# Patient Record
Sex: Female | Born: 1965 | Hispanic: Yes | Marital: Married | Smoking: Former smoker
Health system: Southern US, Community
[De-identification: ages and names within clinical notes are randomized; demographics above are authoritative.]

## PROBLEM LIST (undated history)

## (undated) HISTORY — PX: OTHER SURGICAL HISTORY: SHX169

## (undated) HISTORY — PX: VAGINAL HYSTERECTOMY: SUR661

---

## 2004-11-09 ENCOUNTER — Encounter (INDEPENDENT_AMBULATORY_CARE_PROVIDER_SITE_OTHER): Payer: Self-pay | Admitting: *Deleted

## 2004-11-09 LAB — CONVERTED CEMR LAB

## 2004-11-19 ENCOUNTER — Ambulatory Visit: Payer: Self-pay | Admitting: Family Medicine

## 2004-11-22 ENCOUNTER — Ambulatory Visit (HOSPITAL_COMMUNITY): Admission: RE | Admit: 2004-11-22 | Discharge: 2004-11-22 | Payer: Self-pay | Admitting: *Deleted

## 2004-11-26 ENCOUNTER — Ambulatory Visit: Payer: Self-pay | Admitting: Family Medicine

## 2004-12-31 ENCOUNTER — Ambulatory Visit: Payer: Self-pay | Admitting: Family Medicine

## 2005-01-01 ENCOUNTER — Ambulatory Visit: Payer: Self-pay | Admitting: Family Medicine

## 2005-01-29 ENCOUNTER — Ambulatory Visit: Payer: Self-pay | Admitting: Family Medicine

## 2005-02-12 ENCOUNTER — Ambulatory Visit: Payer: Self-pay | Admitting: Sports Medicine

## 2005-02-27 ENCOUNTER — Ambulatory Visit: Payer: Self-pay | Admitting: Family Medicine

## 2005-03-12 ENCOUNTER — Ambulatory Visit: Payer: Self-pay | Admitting: Family Medicine

## 2005-03-26 ENCOUNTER — Ambulatory Visit: Payer: Self-pay | Admitting: Family Medicine

## 2005-03-31 ENCOUNTER — Ambulatory Visit: Payer: Self-pay | Admitting: Sports Medicine

## 2005-04-07 ENCOUNTER — Ambulatory Visit: Payer: Self-pay | Admitting: Family Medicine

## 2005-04-10 ENCOUNTER — Inpatient Hospital Stay (HOSPITAL_COMMUNITY): Admission: AD | Admit: 2005-04-10 | Discharge: 2005-04-12 | Payer: Self-pay | Admitting: *Deleted

## 2005-04-10 ENCOUNTER — Ambulatory Visit: Payer: Self-pay | Admitting: Family Medicine

## 2005-06-11 ENCOUNTER — Ambulatory Visit: Payer: Self-pay | Admitting: Family Medicine

## 2006-02-27 IMAGING — US US OB DETAIL+14 WK
1 series · 13 of 28 positions shown · non-contrast
Comparison: none

CLINICAL DATA: 38-year-old with scan for anatomy at 20 weeks.

[Series 1: us ob detail+14 wk · 0.41mm/px · 13 of 88 slices shown]
[im 4/88]
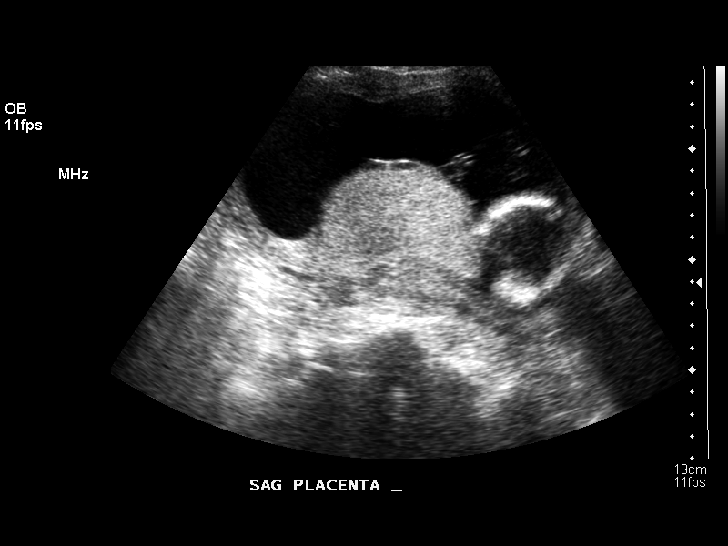
[im 10/88]
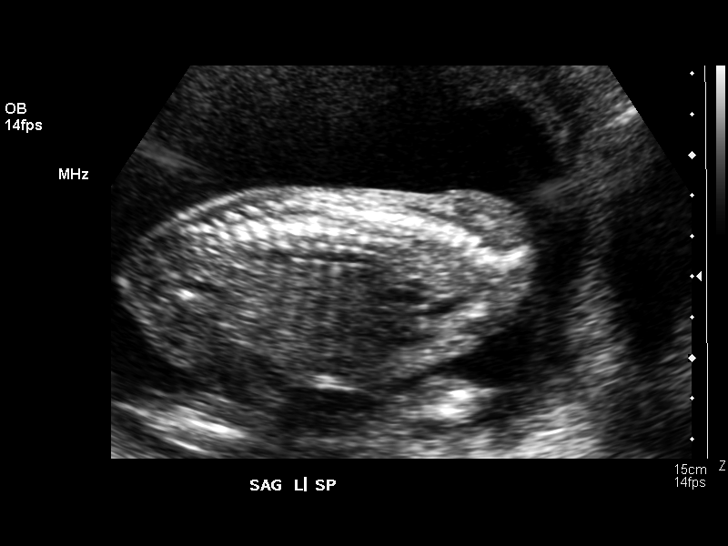
[im 17/88]
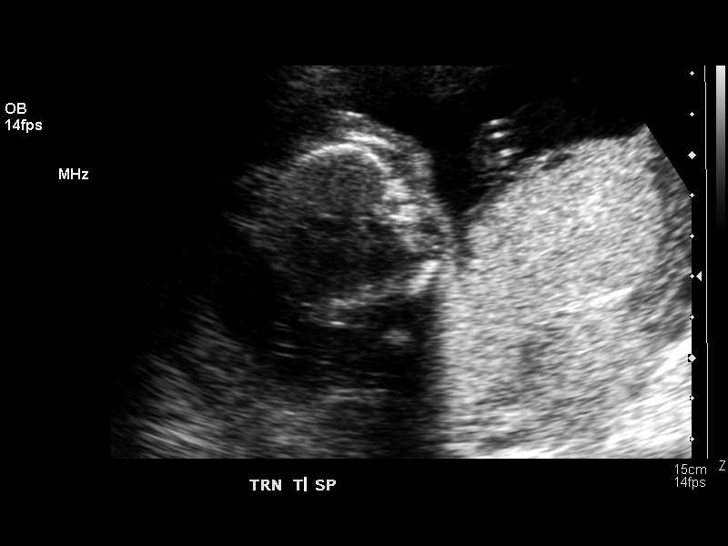
[im 23/88]
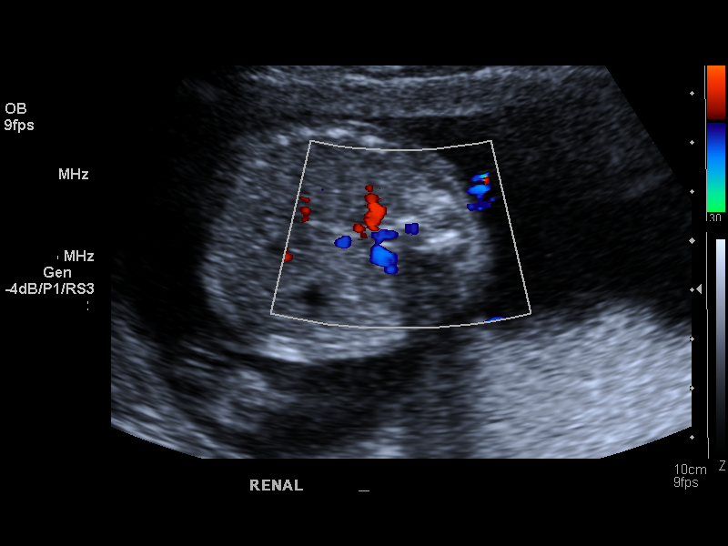
[im 30/88]
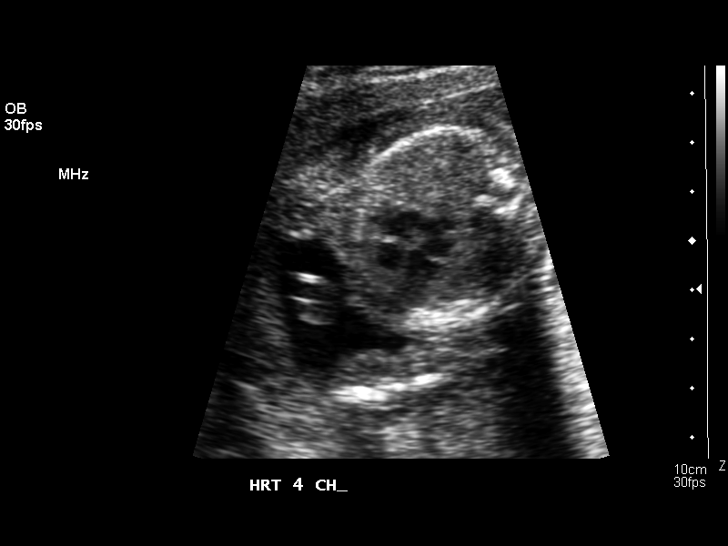
[im 36/88]
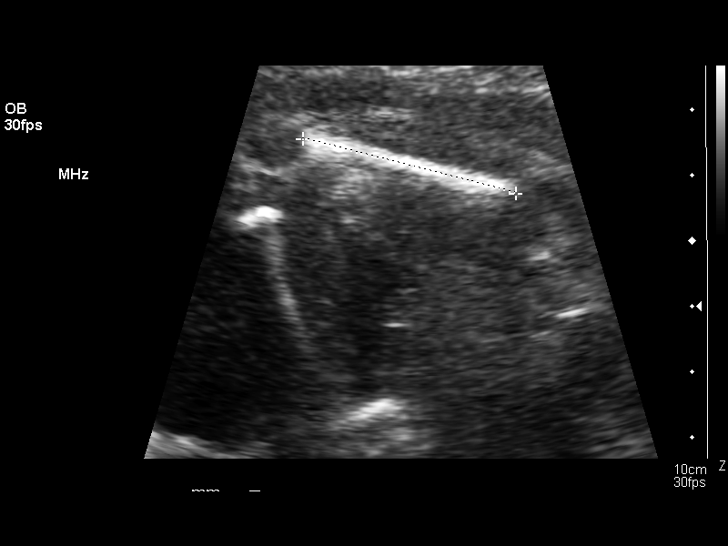
[im 46/88]
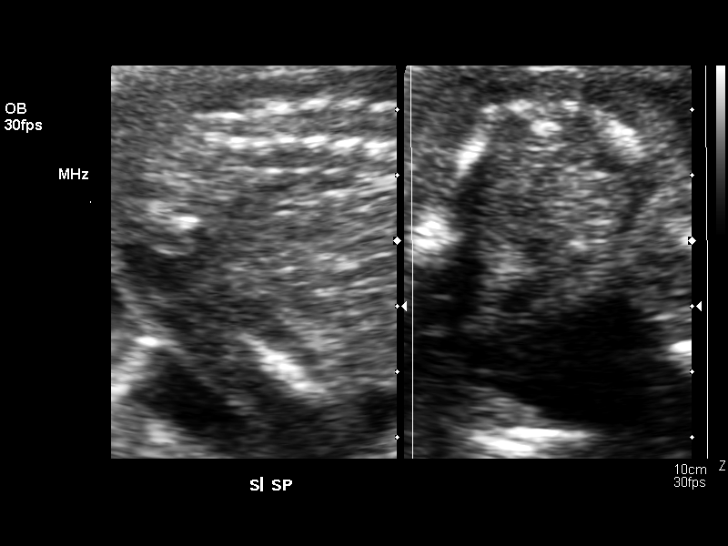
[im 52/88]
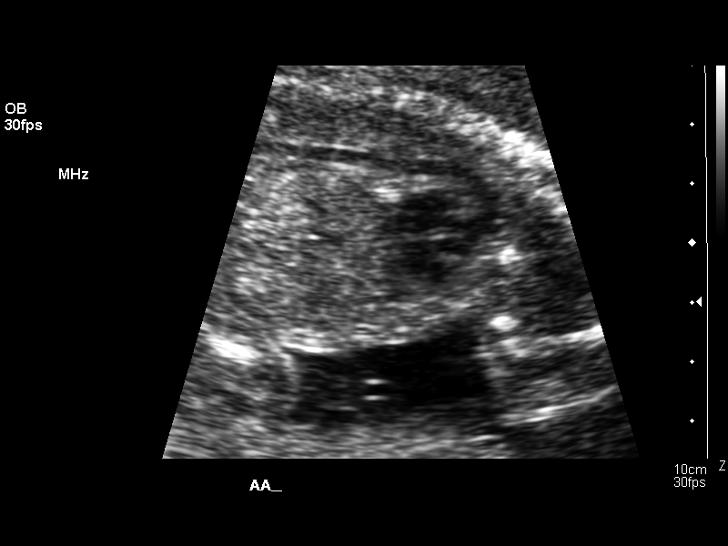
[im 59/88]
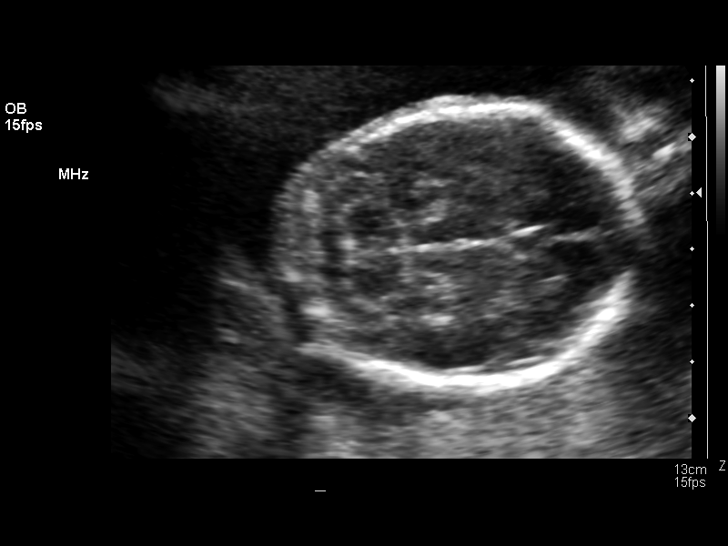
[im 65/88]
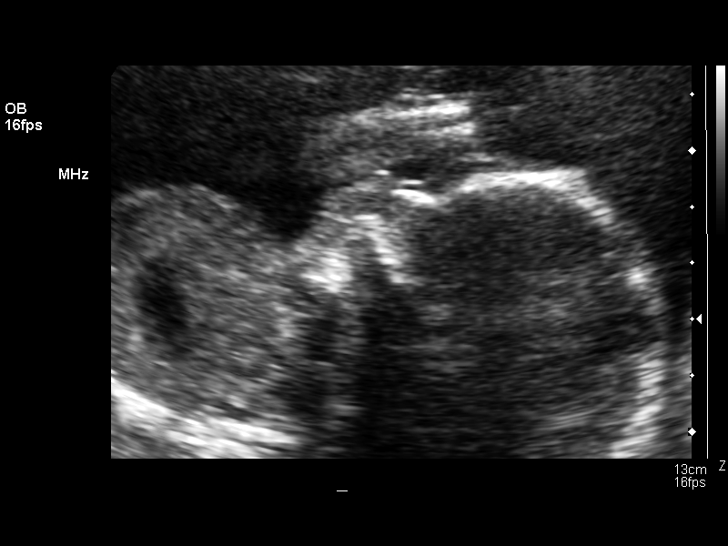
[im 71/88]
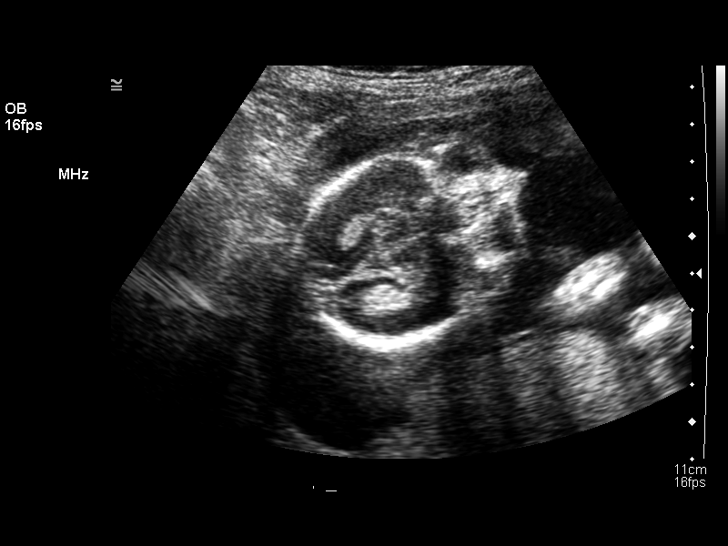
[im 78/88]
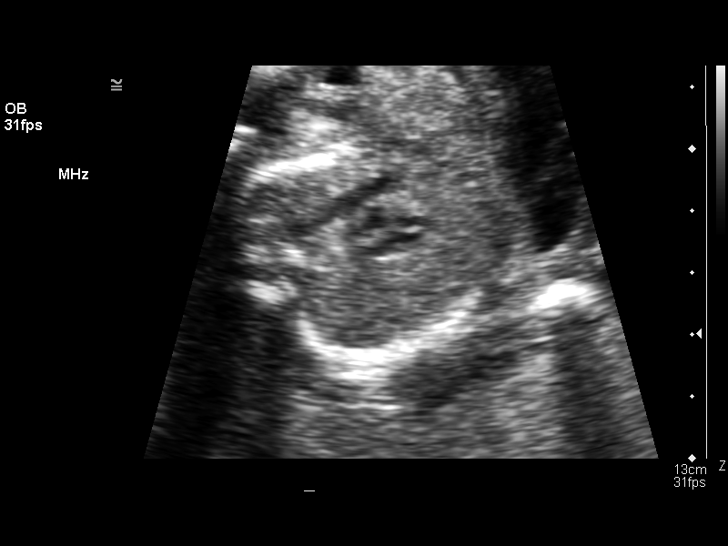
[im 84/88]
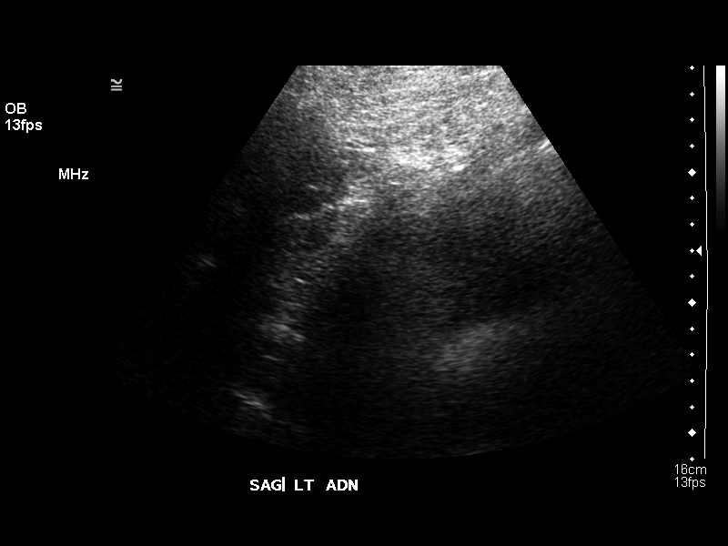

[13 of 28 positions shown; findings below may reference images not displayed]

DETAILED OBSTETRICAL ULTRASOUND:

 Number of Fetuses:  1
 Heart Rate:  139 bpm
 Movement:  Yes 
 Breathing:  No 
 Presentation:  Cephalic/variable
 Placental Location:  Posterior 
 Grade:  1 
 Previa:  No 
 Amniotic Fluid (Subjective):  Normal 
 Amniotic Fluid (Objective):  Vertical pocket 6.1 cm

 FETAL BIOMETRY
 BPD:  5.0 cm  21 w  0 d
 HC:  18.1 cm  20 w  4 d
 AC:  15.6 cm  20 w  5 d
 FL:  3.3 cm  20 w  2 d

 MEAN GA:  20 w  4 d 

 FETAL ANATOMY
 Lateral Ventricles:  Visualized 
 Thalami/CSP:  Visualized 
 Posterior Fossa:  Visualized 
 Nuchal Region:  Visualized 
 Spine:  Visualized 
 4 Chamber Heart on Left:  Visualized 
 Stomach on Left:  Visualized 
 3 Vessel Cord:  Visualized 
 Cord Insertion Site:  Visualized 
 Kidneys:  Visualized 
 Bladder:  Visualized 
 Extremities:  Visualized   

 ADDITIONAL ANATOMY VISUALIZED:  LVOT, RVOT, upper lip, orbits, diaphragm, heel, aortic arch, male genitalia and nasal bone

 MATERNAL UTERINE AND ADNEXAL FINDINGS
 Cervix:  3.3 cm transabdominally
IMPRESSION: Single living intrauterine fetus in variable presentation.  Amniotic fluid volume is within normal limits.   Size and dates correlate within 5 days.   No fetal anomalies are identified.

## 2006-07-10 ENCOUNTER — Encounter (INDEPENDENT_AMBULATORY_CARE_PROVIDER_SITE_OTHER): Payer: Self-pay | Admitting: *Deleted

## 2007-11-08 ENCOUNTER — Telehealth: Payer: Self-pay | Admitting: *Deleted

## 2007-11-09 ENCOUNTER — Ambulatory Visit: Payer: Self-pay | Admitting: Family Medicine

## 2007-11-09 DIAGNOSIS — N912 Amenorrhea, unspecified: Secondary | ICD-10-CM

## 2007-11-09 LAB — CONVERTED CEMR LAB: Beta hcg, urine, semiquantitative: NEGATIVE

## 2009-03-27 ENCOUNTER — Ambulatory Visit: Payer: Self-pay | Admitting: Gynecology

## 2009-03-29 ENCOUNTER — Ambulatory Visit: Payer: Self-pay | Admitting: Gynecology

## 2009-04-02 ENCOUNTER — Ambulatory Visit: Payer: Self-pay | Admitting: Gynecology

## 2009-04-02 ENCOUNTER — Ambulatory Visit (HOSPITAL_BASED_OUTPATIENT_CLINIC_OR_DEPARTMENT_OTHER): Admission: RE | Admit: 2009-04-02 | Discharge: 2009-04-03 | Payer: Self-pay | Admitting: Gynecology

## 2009-04-09 ENCOUNTER — Ambulatory Visit: Payer: Self-pay | Admitting: Gynecology

## 2009-06-05 ENCOUNTER — Ambulatory Visit: Payer: Self-pay | Admitting: Gynecology

## 2009-07-31 ENCOUNTER — Ambulatory Visit: Payer: Self-pay | Admitting: Gynecology

## 2009-08-03 ENCOUNTER — Ambulatory Visit: Payer: Self-pay | Admitting: Gynecology

## 2009-08-08 ENCOUNTER — Ambulatory Visit: Payer: Self-pay | Admitting: Gynecology

## 2009-08-09 ENCOUNTER — Ambulatory Visit (HOSPITAL_BASED_OUTPATIENT_CLINIC_OR_DEPARTMENT_OTHER): Admission: RE | Admit: 2009-08-09 | Discharge: 2009-08-10 | Payer: Self-pay | Admitting: Gynecology

## 2009-08-09 ENCOUNTER — Ambulatory Visit: Payer: Self-pay | Admitting: Gynecology

## 2009-10-03 ENCOUNTER — Ambulatory Visit: Payer: Self-pay | Admitting: Gynecology

## 2009-10-24 ENCOUNTER — Ambulatory Visit (HOSPITAL_COMMUNITY): Admission: RE | Admit: 2009-10-24 | Discharge: 2009-10-24 | Payer: Self-pay | Admitting: Gynecology

## 2010-01-29 ENCOUNTER — Encounter: Admission: RE | Admit: 2010-01-29 | Discharge: 2010-01-29 | Payer: Self-pay | Admitting: Gynecology

## 2010-07-31 LAB — CBC
MCHC: 32.5 g/dL (ref 30.0–36.0)
MCV: 85.6 fL (ref 78.0–100.0)
RBC: 3.55 MIL/uL — ABNORMAL LOW (ref 3.87–5.11)

## 2010-08-14 LAB — CBC
HCT: 25.7 % — ABNORMAL LOW (ref 36.0–46.0)
MCV: 69.8 fL — ABNORMAL LOW (ref 78.0–100.0)
RBC: 3.68 MIL/uL — ABNORMAL LOW (ref 3.87–5.11)

## 2010-08-14 LAB — ELECTROLYTE PANEL
CO2: 25 mEq/L (ref 19–32)
Sodium: 137 mEq/L (ref 135–145)

## 2012-01-26 ENCOUNTER — Encounter: Payer: Self-pay | Admitting: Gynecology

## 2012-01-26 ENCOUNTER — Ambulatory Visit (INDEPENDENT_AMBULATORY_CARE_PROVIDER_SITE_OTHER): Payer: Self-pay | Admitting: Gynecology

## 2012-01-26 VITALS — BP 126/70 | Ht 64.75 in | Wt 142.0 lb

## 2012-01-26 DIAGNOSIS — R635 Abnormal weight gain: Secondary | ICD-10-CM

## 2012-01-26 DIAGNOSIS — Z01419 Encounter for gynecological examination (general) (routine) without abnormal findings: Secondary | ICD-10-CM

## 2012-01-26 DIAGNOSIS — Z833 Family history of diabetes mellitus: Secondary | ICD-10-CM

## 2012-01-26 DIAGNOSIS — Z23 Encounter for immunization: Secondary | ICD-10-CM

## 2012-01-26 LAB — CBC WITH DIFFERENTIAL/PLATELET
Eosinophils Relative: 3 % (ref 0–5)
Hemoglobin: 13.6 g/dL (ref 12.0–15.0)
Lymphs Abs: 1.6 10*3/uL (ref 0.7–4.0)
MCHC: 34.3 g/dL (ref 30.0–36.0)
MCV: 89.6 fL (ref 78.0–100.0)
Neutro Abs: 1.8 10*3/uL (ref 1.7–7.7)
Neutrophils Relative %: 48 % (ref 43–77)
Platelets: 264 10*3/uL (ref 150–400)
RBC: 4.43 MIL/uL (ref 3.87–5.11)
RDW: 13.7 % (ref 11.5–15.5)

## 2012-01-26 LAB — CHOLESTEROL, TOTAL: Cholesterol: 243 mg/dL — ABNORMAL HIGH (ref 0–200)

## 2012-01-26 NOTE — Progress Notes (Signed)
Rachel Fletcher [birth year] [id number]   History:    46 y.o.  for annual gyn exam with no complaints today. Patient with history of transvaginal hysterectomy for symptomatic fibroid uterus and 2011 and has not been seen the office since then. Patient's last mammogram was in 2011. Patient in frequently does her self breast examination. Patient's sister non-insulin-dependent diabetic. Patient does not recall when and if she has ever had the dTap Vaccine. Patient denies any previous abnormal Pap smears.  Past medical history,surgical history, family history and social history were all reviewed and documented in the EPIC chart.  Gynecologic History No LMP recorded. Patient has had a hysterectomy. Contraception: Hysterectomy Last Pap: 2011. Results were: normal Last mammogram: 2011. Results were: normal  Obstetric History OB History    Grav Para Term Preterm Abortions TAB SAB Ect Mult Living   5 2   3  3   2      # Outc Date GA Lbr Len/2nd Wgt Sex Del Anes PTL Lv   1 PAR            2 PAR            3 SAB            4 SAB            5 SAB                ROS: A ROS was performed and pertinent positives and negatives are included in the history.  GENERAL: No fevers or chills. HEENT: No change in vision, no earache, sore throat or sinus congestion. NECK: No pain or stiffness. CARDIOVASCULAR: No chest pain or pressure. No palpitations. PULMONARY: No shortness of breath, cough or wheeze. GASTROINTESTINAL: No abdominal pain, nausea, vomiting or diarrhea, melena or bright red blood per rectum. GENITOURINARY: No urinary frequency, urgency, hesitancy or dysuria. MUSCULOSKELETAL: No joint or muscle pain, no back pain, no recent trauma. DERMATOLOGIC: No rash, no itching, no lesions. ENDOCRINE: No polyuria, polydipsia, no heat or cold intolerance. No recent change in weight. HEMATOLOGICAL: No anemia or easy bruising or bleeding. NEUROLOGIC: No headache, seizures, numbness, tingling or weakness.  PSYCHIATRIC: No depression, no loss of interest in normal activity or change in sleep pattern.     Exam: chaperone present  BP 126/70  Ht 5' 4.75" (1.645 m)  Wt 142 lb (64.411 kg)  BMI 23.81 kg/m2  Body mass index is 23.81 kg/(m^2).  General appearance : Well developed well nourished female. No acute distress HEENT: Neck supple, trachea midline, no carotid bruits, no thyroidmegaly Lungs: Clear to auscultation, no rhonchi or wheezes, or rib retractions  Heart: Regular rate and rhythm, no murmurs or gallops Breast:Examined in sitting and supine position were symmetrical in appearance, no palpable masses or tenderness,  no skin retraction, no nipple inversion, no nipple discharge, no skin discoloration, no axillary or supraclavicular lymphadenopathy Abdomen: no palpable masses or tenderness, no rebound or guarding Extremities: no edema or skin discoloration or tenderness  Pelvic:  Bartholin, Urethra, Skene Glands: Within normal limits             Vagina: No gross lesions or discharge  Cervix: Absent  Uterus absent  Adnexa  Without masses or tenderness  Anus and perineum  normal   Rectovaginal  normal sphincter tone without palpated masses or tenderness             Hemoccult not indicated     Assessment/Plan:  46 y.o. female for annual exam who was  counseled for the dTap Vaccine today and he was administered. The following lab work today: CBC, screening cholesterol, TSH, hemoglobin A1c, and urinalysis. New Pap smear screening guidelines discussed in no Pap smear was done today. Patient with prior hysterectomy for fibroid uterus and no prior history of abnormal Pap smears in the past. Literature information was provided on breast examination. Requisition was provided for her to schedule her mammogram.    Ok Edwards MD, 12:50 PM 01/26/2012

## 2012-01-26 NOTE — Patient Instructions (Addendum)
Auto examen de mama (Breast Self-Exam) El auto examen puede ayudarla a encontrar modificaciones o trastornos en la mama cuando todava son pequeos. Haga el auto examen de la mama:  [employer].   Una semana despus de su perodo (ciclo menstrual o periodo menstrual).   El Social worker de cada mes, si ya no tiene el perodo.  Debe estar atenta a:  Cambios en el color, tamao o forma de la mama.   Hoyuelos en los senos.   Modificaciones en los pezones o la piel.   Sequedad en la piel de los senos o los pezones.   Secreciones acuosas o sanguinolentas en los pezones.  Trate de sentir la presencia de:  Bultos.   Durezas.   Cualquier otro cambio.  CUIDADOS EN EL HOGAR  Hay 3 formas de hacer el examen autoexamen de mama: Prese frente a un espejo.  Levante los brazos por arriba de la cabeza y gire de un lado al otro.   Coloque las [employer] caderas e inclnese hacia abajo y luego gire de un lado al otro.   Inclnese hacia delante y gire de un lado al otro.  En la ducha.  Con las manos enjabonadas, revise los [employer]. Luego controle por [place] y por debajo de la clavcula y las [place].   Pase los dedos por la zona superior e inferior de la clavcula hasta debajo del seno, y desde el centro del pecho hasta el borde exterior de Management consultant. Controle si hay bultos o zonas duras.   Utilizando las yemas de tres dedos del medio revise todo su seno presionando la mano sobre el [place], haciendo crculos o movimientos hacia arriba y [place].  Acostada.  Acustese sobre su cama.   Coloque una almohada pequea debajo de la mama que va a Chief Operating Officer. En esa misma posicin, ponga la mano detrs de la cabeza.   Con la [employer], use los 3 dedos del medio para Public house manager.   Mueva los dedos en un crculo alrededor de la mama. Presione firmemente sobre todas las partes de la mama para Engineer, manufacturing bultos.  SOLICITE AYUDA DE INMEDIATO SI: Encuentra algn cambio para que puedan  realizarle un estudio. Document Released: 05/31/2010 Document Revised: 04/17/2011 [facility] Patient Information 2012 [place], [place].  Vacuna contra la difteria, el ttanos, y la tos ferina Lo que usted necesita saber (Diphtheria, Tetanus, and Pertussis [DTaP] Vaccine) POR QU VACUNARSE? La difteria, el ttano y la tos [place] son enfermedades graves provocadas por bacterias. La difteria y la tos [place] se [place] de persona a Social worker. El ttano ingresa al cuerpo a travs de cortes o heridas. La difteria produce un recubrimiento denso en la parte trasera de la garganta.  Puede producir problemas para respirar, parlisis, insuficiencia cardaca, e incluso la muerte.  El ttanos causa contracturas dolorosas de los msculos, a menudo en todo el cuerpo.  Puede ocasionar un "bloqueo" de la [place], de modo que es imposible abrir la boca o tragar. El ttanos produce la muerte en alrededor de 2 cada 10 casos.  La tos ferina produce ataques de tos tan fuertes que, en nios, imposibilita comer, beber o respirar. Estos ataques pueden durar semanas.  Puede producir neumona, convulsiones (ataques de espasmos o ausencias), dao cerebral, y la muerte.  La vacuna para la difteria, el ttano y la tos [place] (DTPa) puede ayudar a Market researcher. La mayor parte de los nios que la reciben estarn protegidos durante toda su niez. Muchos ms nios  padeceran estas enfermedades si no fueran vacunados. La DTPa es una versin ms segura de una vacuna anterior denominada DTP. La DTP se ha dejado de [employer]. QUIN DEBE RECIBIR ESTA VACUNA Y CUNDO? Los nios deberan recibir 5 dosis de la vacuna DTPa, una dosis en cada una de las siguientes edades:  2 meses.   4 meses.   6 meses.   15 a 18 meses.   4 a 6 aos.  La vacuna DTPa puede darse en simultneo con otras vacunas. ALGUNOS NIOS NO DEBERAN DARSE LA VACUNA DTPA O DEBERAN ESPERAR  Aquellos nios con trastornos  menores, tales como resfros, pueden ser vacunados, pero aquellos con trastornos moderados a graves deberan esperar hasta su recuperacin para recibir la vacuna DTPa.   Cualquier nio que haya tenido una reaccin alrgica grave luego de una dosis de DTPa no debera recibir otra dosis.   Cualquier nio que haya sufrido una enfermedad cerebral o del sistema nervioso luego de [address] de haber recibido una dosis de la vacuna DTPa, no debera recibir otra dosis.   Hable con el mdico si el nio:   Ha tenido convulsiones o sufri un colapso luego de una dosis de DTPa.   Ha llorado sin parar durante 3 horas o ms luego de una dosis de DTPa.   Ha tenido fiebre mayor a 105 F (40.6 C) luego de una dosis de DTPa.   Pida ms informacin al profesional que lo asiste. Algunos de estos nios podrn recibir una vacuna que no protege para la tos [place], [place] DT.  NIOS DE MAYOR EDAD Y ADULTOS  La vacuna DTPa no se administra en adolescentes, adultos, o nios mayores a los 7 aos de [place].   Sin embargo, Therapist, art an requieren proteccin. Existe una vacuna llamada Tdap, que es similar a la DTPa. Se recomienda una dosis nica de Tdap en personas desde los 11 a los 64 aos de [place]. Otra vacuna, llamada Td, provee proteccin contra el ttanos y la difteria, pero no contra la tos [place]. Se recomienda su aplicacin cada 10 aos.  CULES SON LOS RIESGOS DE LA VACUNA DTPA?  Enfermarse de difteria, ttanos o pertusis es mucho ms peligroso que recibir la vacuna DTPa.   Sin embargo, una [place], como cualquier otro medicamento, puede causar problemas serios, como Therapist, art graves. El riesgo de que la vacuna DTPa cause daos graves o la muerte es extremadamente pequeo.  Problemas leves (comunes)  Fiebre (en hasta 1 de cada 4 nios).   Enrojecimiento o inflamacin en el lugar en el que se dio la inyeccin (en hasta 1 de cada 4 nios).   Dolor o sensibilidad en Immunologist en el que se dio la  inyeccin (en hasta 1 de cada 4 nios).  Estos problemas ocurren ms a menudo luego de la cuarta y [place] dosis de vacuna DTPa que en las dosis anteriores. En ocasiones luego de la cuarta o quinta dosis se observa la inflamacin de la pierna o brazo completo en que se ha dado la inyeccin, y puede durar de 1 a 7 das (en hasta 1 nio de cada 30). Otros problemas leves incluyen:  Irritabilidad (en hasta 1 de cada 3 nios).   Cansancio o falta de apetito (en hasta 1 de cada 10 nios).   Vmitos (en hasta 1 de cada 50 nios).  Estos problemas ocurren generalmente de 1 a 3 das luego de la inyeccin. Problemas moderados (poco frecuentes)  Convulsiones (sacudones o fijacin  de la mirada) (en hasta 1 de cada 14.000 nios).   Llanto sin parar durante 3 horas o ms (en hasta 1 nio de cada 1.000).   Fiebre alta, mayor a 105 F (40.6 C) (alrededor de 1 nio cada 16.000).  Problemas graves (muy raros)  Automotive engineer grave (menos de 1 por milln de dosis).   Se han informado varios otros problemas graves luego de la aplicacin de la vacuna DTPa. Estos incluyen:   Convulsiones a largo plazo, coma, o reduccin de la conciencia.   Dao permanente al cerebro.  Estos son casos tan poco frecuentes que resulta difcil saber si fueron provocados por la vacuna. Controlar la fiebre es particularmente importante para los nios que han tenido convulsiones, por cualquier motivo. Tambin es importante si otro miembro de la familia ha tenido convulsiones. Puede reducir la fiebre y el dolor dando al nio un analgsico sin aspirina al recibir la vacuna, y durante las siguientes 24 horas, segn las instrucciones del [place]. QU PASA SI HAY UNA REACCIN MODERADA O GRAVE? A qu debo prestar atencin? Cualquier cosa extraa o poco comn, como una reaccin alrgica, fiebre alta o comportamiento extrao. Es muy poco comn que ocurran reacciones alrgicas graves con cualquier vacuna. Si se produjera una,  sera dentro de los primeros minutos hasta algunas horas luego de la inyeccin. Podr observar dificultad para respirar, ronquera o silbidos al respirar, ronchas, palidez, debilidad, frecuencia cardaca elevada, o mareos. Si ocurrieran fiebre o convulsiones, normalmente sera dentro de la primera semana luego de la inyeccin. Qu debo hacer?  Comunquese con el mdico o lleve inmediatamente a la persona a un mdico.   Diga al mdico lo que ocurri, la fecha y hora en que ocurri, y cundo recibi la vacuna.   Pida al mdico, enfermera, o al servicio de salud que complete el informe [employer] efectos adversos de la vacuna (Vaccine Adverse Event Reporting System, VAERS). O, bien puede completar el informe a travs del sitio web de VAERS en www.vaers.LAgents.no o llamando al [phone].  VAERS no proporciona consejos mdicos. EL PROGRAMA NACIONAL DE COMPENSACIN POR LESIONES CAUSADAS POR VACUNAS (NATIONAL VACCINE INJURY COMPENSATION PROGRAM)  En el raro caso en que usted o su hijo hayan tenido una reaccin grave a Cathleen Corti, se ha creado un programa federal para ayudarlo a Network engineer atencin de los lesionados.   Para obtener detalles acerca del [place] de Compensacin por Lesiones Causadas por [employer], llame al [phone] o visite el sitio web del programa en SpiritualWord.at  CMO OBTENER MS INFORMACIN?  Consulte con el profesional que lo asiste. Podr darle el prospecto de la vacuna o sugerirle otras fuentes de informacin.   Llame al programa de vacunacin del departamento de salud local o estatal.   Comunquese con los Centers for [employer] and Prevention (Centros para el control y la prevencin de enfermedades, CDC).   Llame al [phone] (1-800-CDC-INFO).   Visite el sitio web del [employer] de [place], en PicCapture.uy  CDC Diphtheria, Tetanus, and Pertussis-Spanish VIS (09/25/05) Document Released: 07/25/2008 Document  Revised: 04/17/2011 [facility] Patient Information 2012 [place], [place].

## 2012-01-27 ENCOUNTER — Other Ambulatory Visit: Payer: Self-pay | Admitting: Gynecology

## 2012-01-27 DIAGNOSIS — R739 Hyperglycemia, unspecified: Secondary | ICD-10-CM

## 2012-01-27 DIAGNOSIS — E7800 Pure hypercholesterolemia, unspecified: Secondary | ICD-10-CM

## 2012-01-27 LAB — URINALYSIS W MICROSCOPIC + REFLEX CULTURE
Bacteria, UA: NONE SEEN
Casts: NONE SEEN
Crystals: NONE SEEN
Glucose, UA: NEGATIVE mg/dL
Hgb urine dipstick: NEGATIVE
Ketones, ur: NEGATIVE mg/dL
Nitrite: NEGATIVE
Protein, ur: NEGATIVE mg/dL
Specific Gravity, Urine: 1.029 (ref 1.005–1.030)
pH: 5.5 (ref 5.0–8.0)

## 2012-02-27 ENCOUNTER — Other Ambulatory Visit: Payer: Self-pay

## 2012-02-27 DIAGNOSIS — E7800 Pure hypercholesterolemia, unspecified: Secondary | ICD-10-CM

## 2012-02-27 DIAGNOSIS — R739 Hyperglycemia, unspecified: Secondary | ICD-10-CM

## 2012-02-28 LAB — LIPID PANEL
LDL Cholesterol: 166 mg/dL — ABNORMAL HIGH (ref 0–99)
Total CHOL/HDL Ratio: 4.1 Ratio
VLDL: 17 mg/dL (ref 0–40)

## 2013-11-14 ENCOUNTER — Other Ambulatory Visit: Payer: Self-pay

## 2013-11-14 ENCOUNTER — Ambulatory Visit (INDEPENDENT_AMBULATORY_CARE_PROVIDER_SITE_OTHER): Payer: Self-pay | Admitting: Family Medicine

## 2013-11-14 VITALS — BP 110/78 | HR 69 | Temp 98.4°F | Resp 16 | Ht 64.0 in | Wt 147.0 lb

## 2013-11-14 DIAGNOSIS — M79674 Pain in right toe(s): Secondary | ICD-10-CM

## 2013-11-14 DIAGNOSIS — M79609 Pain in unspecified limb: Secondary | ICD-10-CM

## 2013-11-14 DIAGNOSIS — M7989 Other specified soft tissue disorders: Secondary | ICD-10-CM

## 2013-11-14 MED ORDER — INDOMETHACIN 25 MG PO CAPS: ORAL_CAPSULE | ORAL | Status: DC

## 2013-11-14 MED ORDER — DOXYCYCLINE HYCLATE 100 MG PO TABS: 100.0000 mg | ORAL_TABLET | Freq: Two times a day (BID) | ORAL | Status: DC

## 2013-11-14 NOTE — Progress Notes (Signed)
Chief Complaint:  Chief Complaint  Patient presents with  . Foot Pain    right, insect bite    HPI: Rachel Fletcher is a 48 y.o. female who is here for  1 day hx  3rd toe pain on her right foot, she thinks she may have been bitten by it. She was already on PCN for her teeth, she has problems walkign on it. She has pain and swelling. She denies fevers or chills. She denies gout.   No past medical history on file. Past Surgical History  Procedure Laterality Date  . Vaginal hysterectomy      TVH  . Resection of submucous prolapsed myoma     History   Social History  . Marital Status: Married    Spouse Name: N/A    Number of Children: N/A  . Years of Education: N/A   Social History Main Topics  . Smoking status: Former Games developermoker  . Smokeless tobacco: Never Used  . Alcohol Use: No  . Drug Use: No  . Sexual Activity: Yes    Birth Control/ Protection: Surgical     Comment: TVH   Other Topics Concern  . None   Social History Narrative  . None   Family History  Problem Relation Age of Onset  . Diabetes Sister    No Known Allergies Prior to Admission medications   Medication Sig Start Date End Date Taking? Authorizing Provider  CALCIUM PO Take by mouth.    Historical Provider, MD     ROS: The patient denies fevers, chills, night sweats, unintentional weight loss, chest pain, palpitations, wheezing, dyspnea on exertion, nausea, vomiting, abdominal pain, dysuria, hematuria, melena, numbness, weakness, or tingling.   All other systems have been reviewed and were otherwise negative with the exception of those mentioned in the HPI and as above.    PHYSICAL EXAM: Filed Vitals:   11/14/13 1722  BP: 110/78  Pulse: 69  Temp: 98.4 F (36.9 C)  Resp: 16   Filed Vitals:   11/14/13 1722  Height: 5\' 4"  (1.626 m)  Weight: 147 lb (66.679 kg)   Body mass index is 25.22 kg/(m^2).  General: Alert, no acute distress HEENT:  Normocephalic, atraumatic,  oropharynx patent. EOMI, PERRLA Cardiovascular:  Regular rate and rhythm, no rubs murmurs or gallops.  No Carotid bruits, radial pulse intact. No pedal edema.  Respiratory: Clear to auscultation bilaterally.  No wheezes, rales, or rhonchi.  No cyanosis, no use of accessory musculature GI: No organomegaly, abdomen is soft and non-tender, positive bowel sounds.  No masses. Skin: No rashes. Neurologic: Facial musculature symmetric. Psychiatric: Patient is appropriate throughout our interaction. Lymphatic: No cervical lymphadenopathy Musculoskeletal: Gait intact. Right 3rd toe, red, swollen, tender, full ROM, 5/5 strength   LABS: Results for orders placed in visit on 02/27/12  LIPID PANEL      Result Value Ref Range   Cholesterol 243 (*) 0 - 200 mg/dL   Triglycerides 86  <027<150 mg/dL   HDL 60  >25>39 mg/dL   Total CHOL/HDL Ratio 4.1     VLDL 17  0 - 40 mg/dL   LDL Cholesterol 366166 (*) 0 - 99 mg/dL  GLUCOSE, RANDOM      Result Value Ref Range   Glucose, Bld 95  70 - 99 mg/dL     EKG/XRAY:   [facility]   ASSESSMENT/PLAN: Encounter Diagnoses  Name Primary?  . Pain of toe of right foot Yes  .  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 Patient is aware that all medications have potential sideeffects and we are unable to predict every sideeffect or drug-drug interaction that may occur.  Hamilton CapriLE, Bambie Pizzolato PHUONG, DO 11/14/2013 6:21 PM

## 2013-11-24 ENCOUNTER — Other Ambulatory Visit: Payer: Self-pay | Admitting: Obstetrics and Gynecology

## 2013-11-24 DIAGNOSIS — Z1231 Encounter for screening mammogram for malignant neoplasm of breast: Secondary | ICD-10-CM

## 2013-12-09 ENCOUNTER — Ambulatory Visit: Payer: Self-pay | Attending: Internal Medicine

## 2013-12-13 ENCOUNTER — Encounter (HOSPITAL_COMMUNITY): Payer: Self-pay

## 2013-12-13 ENCOUNTER — Ambulatory Visit (HOSPITAL_COMMUNITY)
Admission: RE | Admit: 2013-12-13 | Discharge: 2013-12-13 | Disposition: A | Discharge status: Home / Self Care | Payer: Self-pay | Source: Ambulatory Visit | Attending: Obstetrics and Gynecology | Admitting: Obstetrics and Gynecology

## 2013-12-13 ENCOUNTER — Encounter (HOSPITAL_COMMUNITY): Payer: Self-pay | Admitting: *Deleted

## 2013-12-13 VITALS — BP 102/60 | Temp 97.8°F | Wt 148.0 lb

## 2013-12-13 DIAGNOSIS — Z1231 Encounter for screening mammogram for malignant neoplasm of breast: Secondary | ICD-10-CM

## 2013-12-13 DIAGNOSIS — Z1239 Encounter for other screening for malignant neoplasm of breast: Secondary | ICD-10-CM

## 2013-12-13 NOTE — Patient Instructions (Signed)
Explained to patient that she will not need need any further Pap smears due to history of a hysterectomy for benign reasons and no history of an abnormal Pap smear. Let patient know that [facility] will follow up with her within the next couple weeks with results of mammogram by letter or phone. Rachel BeanRosalia Pecina Verbalized understanding. Patient escorted to mammography for a screening mammogram.

## 2013-12-13 NOTE — Progress Notes (Signed)
[name].  Pap Smear:  Pap smear not completed today. [facility] and normal per patient. Per patient has no history of an abnormal Pap smear. Patient has a history of a hysterectomy 08/09/2009 for DUB. Patient no longer needs Pap smears due to her history of a hysterectomy for benign reasons per ACOG and BCCCP guidelines. No Pap smear results in EPIC.  Physical exam: Breasts Breasts symmetrical. No skin abnormalities bilateral breasts. No nipple retraction bilateral breasts. No nipple discharge bilateral breasts. No lymphadenopathy. No lumps palpated bilateral breasts. No complaints of pain or tenderness on exam. Patient escorted to mammography for a screening mammogram.        Pelvic/Bimanual No Pap smear completed today since last Pap smear was July 2015 per patient. Pap smear not indicated per BCCCP guidelines.

## 2014-03-13 ENCOUNTER — Encounter (HOSPITAL_COMMUNITY): Payer: Self-pay

## 2014-11-23 ENCOUNTER — Other Ambulatory Visit: Payer: Self-pay | Admitting: Obstetrics and Gynecology

## 2014-12-06 ENCOUNTER — Other Ambulatory Visit (HOSPITAL_COMMUNITY): Payer: Self-pay | Admitting: Nurse Practitioner

## 2014-12-06 DIAGNOSIS — Z1231 Encounter for screening mammogram for malignant neoplasm of breast: Secondary | ICD-10-CM

## 2015-02-09 ENCOUNTER — Other Ambulatory Visit: Payer: Self-pay

## 2015-02-09 DIAGNOSIS — Z1231 Encounter for screening mammogram for malignant neoplasm of breast: Secondary | ICD-10-CM

## 2015-02-27 ENCOUNTER — Ambulatory Visit: Payer: Self-pay

## 2015-06-04 ENCOUNTER — Other Ambulatory Visit: Payer: Self-pay | Admitting: Obstetrics and Gynecology

## 2015-06-04 DIAGNOSIS — Z1231 Encounter for screening mammogram for malignant neoplasm of breast: Secondary | ICD-10-CM

## 2015-06-07 ENCOUNTER — Ambulatory Visit (HOSPITAL_COMMUNITY)
Admission: RE | Admit: 2015-06-07 | Discharge: 2015-06-07 | Disposition: A | Discharge status: Home / Self Care | Payer: Self-pay | Source: Ambulatory Visit | Attending: Obstetrics and Gynecology | Admitting: Obstetrics and Gynecology

## 2015-06-07 ENCOUNTER — Ambulatory Visit
Admission: RE | Admit: 2015-06-07 | Discharge: 2015-06-07 | Disposition: A | Discharge status: Home / Self Care | Payer: No Typology Code available for payment source | Source: Ambulatory Visit | Attending: Obstetrics and Gynecology | Admitting: Obstetrics and Gynecology

## 2015-06-07 ENCOUNTER — Encounter (HOSPITAL_COMMUNITY): Payer: Self-pay

## 2015-06-07 VITALS — BP 114/78 | Temp 98.1°F | Ht 64.0 in | Wt 163.0 lb

## 2015-06-07 DIAGNOSIS — Z1239 Encounter for other screening for malignant neoplasm of breast: Secondary | ICD-10-CM

## 2015-06-07 DIAGNOSIS — Z1231 Encounter for screening mammogram for malignant neoplasm of breast: Secondary | ICD-10-CM

## 2015-06-07 NOTE — Progress Notes (Signed)
[name].   Pap Smear: Pap smear not completed today. Last [facility] and normal per patient. Per patient has no history of an abnormal Pap smear. Patient has a history of a hysterectomy 08/09/2009 for DUB. Patient no longer needs Pap smears due to her history of a hysterectomy for benign reasons per ACOG and BCCCP guidelines. No Pap smear results in EPIC.  Physical exam: Breasts Breasts symmetrical. No skin abnormalities bilateral breasts. No nipple retraction bilateral breasts. No nipple discharge bilateral breasts. No lymphadenopathy. No lumps palpated bilateral breasts. No complaints of pain or tenderness on exam. Referred patient to the [facility] for a screening mammogram. Appointment scheduled for Thursday, June 07, 2015 at 1530.   Pelvic/Bimanual No Pap smear completed today since last Pap smear was July 2015 per patient. Pap smear not indicated per BCCCP guidelines.   Smoking History: Patient is a former smoker that quit 05/12/2010.  Patient Navigation: Patient education provided. Access to services provided for patient through [facility] program. Spanish interpreter provided.  Used Spanish interpreter [employer].

## 2015-06-07 NOTE — Patient Instructions (Addendum)
Educational materials on self breast awareness given. Explained to Rachel Fletcher a Pap smear today due to last Pap smear was in July 2015 per patient. Let her know BCCCP will cover Pap smears every 3 years unless has a history of abnormal Pap smears. Referred patient to the [facility] for a screening mammogram. Appointment scheduled for Thursday, June 07, 2015 at 1530. Patient aware of appointment and will be there. Let patient know the Breast Center will follow up with her within the next couple weeks with results by letter or phone. [place] Agustiniano [place] verbalized understanding.  Schawn Byas, Kathaleen Maser, RN 2:46 PM

## 2015-06-11 ENCOUNTER — Encounter (HOSPITAL_COMMUNITY): Payer: Self-pay | Admitting: *Deleted

## 2015-06-22 ENCOUNTER — Other Ambulatory Visit: Payer: Self-pay

## 2015-06-22 ENCOUNTER — Ambulatory Visit (HOSPITAL_BASED_OUTPATIENT_CLINIC_OR_DEPARTMENT_OTHER): Payer: Self-pay

## 2015-06-22 VITALS — BP 114/80 | HR 72 | Temp 97.8°F | Resp 14 | Ht 63.0 in | Wt 161.0 lb

## 2015-06-22 DIAGNOSIS — Z0000 Encounter for general adult medical examination without abnormal findings: Secondary | ICD-10-CM

## 2015-06-22 LAB — LIPID PANEL
CHOLESTEROL TOTAL: 295 mg/dL — AB (ref 100–199)
Chol/HDL Ratio: 4.3 ratio units (ref 0.0–4.4)
HDL: 69 mg/dL (ref >39)
LDL Calculated: 199 mg/dL — ABNORMAL HIGH (ref 0–99)
Triglycerides: 135 mg/dL (ref 0–149)
VLDL CHOLESTEROL CAL: 27 mg/dL (ref 5–40)

## 2015-06-22 LAB — GLUCOSE (CC13): GLUCOSE: 100 mg/dL (ref 70–140)

## 2015-06-22 NOTE — Patient Instructions (Signed)
Discussed health assessment with patient. She will be called with results of lab work and we will then discussed any further follow up the patient needs. Will set up health coaching.  Patient verbalized understanding.

## 2015-06-22 NOTE — Progress Notes (Signed)
Patient is a new patient to the [facility] program and is currently a [facility] patient effective 06/07/2015 with interpreter Roslynn Amble.  Clinical Measurements: Patient is 5 ft. 3 inches, weight 161 lbs, BMI 28.6.   Medical History: Patient has no history of high cholesterol. Patient does not have a history of hypertension or diabetes. Per patient no diagnosed history of coronary heart disease, heart attack, heart failure, stroke/TIA, vascular disease or congenital heart defects.   Blood Pressure, Self-measurement: Patient states has no reason to check Blood pressure.  Nutrition Assessment: Patient stated that does not eat fruit every day. Patient states she doe not eat vegetables every day. Per patient states does eat 3 or more ounces of whole grains daily. Patient stated doesn't eat two or more servings of fish weekly. Patient states she does not drink more than 36 ounces or 450 calories of beverages with added sugars weekly. Patient stated she does not watch her salt intake. Marland Kitchen  Physical Activity Assessment: Patient stated that does around 70 minutes of moderate exercise a week and rarely does any vigorous exercise.  Smoking Status: Patient has smoked but quit over 5 years ago and is not exposed to smoke.   Quality of Life Assessment: In assessing patient's quality of life she stated that out of the past 30 days that she has felt her health is good all of them. Patient also stated that in the past 30 days that her mental health was good including stress, depression and problems with emotions for all days. Patient did state that out of the past 30 days she felt her physical or mental health had not kept her from doing her usual activities including self-care, work or recreation.   Plan: Lab work will be done today including a lipid panel, blood glucose, and Hgb A1C. Will call lab results when they are finished. Will discuss risk reduction counseling when call results.

## 2015-06-23 LAB — HEMOGLOBIN A1C
Est. average glucose Bld gHb Est-mCnc: 140 mg/dL
Hemoglobin A1c: 6.5 % — ABNORMAL HIGH (ref 4.8–5.6)

## 2015-06-26 ENCOUNTER — Telehealth: Payer: Self-pay

## 2015-06-26 NOTE — Telephone Encounter (Signed)
Called per Delorise Royals interpreter to inform about lab work from 06/22/15. Interpreter informed patient: cholesterol- 295, HDL- 69, LDL- 199, triglycerides - 413, Bld Glucose - 100 , HBG-A1C - 6.5, and BMI 28.6. Told patient of appointment at [facility] on Tuesday, February 21 at 3 PM for elevated cholesterol and A1C.Interpreter Facility address and telephone number given. Patient was informed not to let doctor do any other lab work or injections. [facility] does not cover and patient would be billed.   Interpreter helped with risk reduction counseling/Heach Coaching related to Cholesterol, LDL, A1C and BMI. Discussed no sugar, sugar substitutes, and decreasing carbohydrates. Patient wants to come in for more coaching face to face. Health Coaching appointment set after doctor's appointment for March 3 at 9:30 AM.  NAVIGATION NEEDS ASSESSMENT AND CARE PLAN: Patient does not need additional social support. Patient has not seen a doctor in 2 years and before that was in 2013. There is possible lack of access to services.Patient understands why she has to be followed up. Only other barrier may have been that patient is not aware of some services to assist the low income. Will assess barriers further when comes for Health Coaching second session.  PLAN: Patient will attend doctor's appointment. Will call patient back after appointment to see how appointment went. Patient and I will do health coaching and set goals.

## 2015-07-03 ENCOUNTER — Ambulatory Visit: Payer: Self-pay | Admitting: Family Medicine

## 2015-07-03 ENCOUNTER — Encounter: Payer: Self-pay | Admitting: Family Medicine

## 2015-07-03 VITALS — BP 113/75 | HR 77 | Temp 97.6°F | Ht 63.0 in | Wt 162.0 lb

## 2015-07-03 DIAGNOSIS — E119 Type 2 diabetes mellitus without complications: Secondary | ICD-10-CM | POA: Insufficient documentation

## 2015-07-03 DIAGNOSIS — E785 Hyperlipidemia, unspecified: Secondary | ICD-10-CM | POA: Insufficient documentation

## 2015-07-03 DIAGNOSIS — E663 Overweight: Secondary | ICD-10-CM | POA: Insufficient documentation

## 2015-07-03 NOTE — Assessment & Plan Note (Signed)
A1c 6.5 No evidence of diabetic foot complications Advised diet and exercise lifestyle modifications Follow-up in 3 months

## 2015-07-03 NOTE — Patient Instructions (Signed)
La diabetes mellitus y los alimentos (Diabetes Mellitus and Food) Es importante que controle su nivel de azcar en la sangre (glucosa). El nivel de glucosa en sangre depende en gran medida de lo que usted come. Comer alimentos saludables en las cantidades [place] a lo largo del Training and development officer, aproximadamente a la misma hora [employer], lo ayudar a Chief Technology Officer su nivel de Multimedia programmer. Tambin puede ayudarlo a retrasar o Patent attorney de la diabetes mellitus. Comer de [employer] saludable incluso puede ayudarlo a Chartered loss adjuster de presin arterial y a Science writer o Theatre manager un peso saludable.  Entre las recomendaciones generales para alimentarse y Audiological scientist los alimentos de forma saludable, se incluyen las siguientes:  Respetar las comidas principales y comer colaciones con regularidad. Evitar pasar largos perodos sin comer con el fin de perder peso.  Seguir una dieta que consista principalmente en alimentos de origen vegetal, como frutas, vegetales, frutos secos, legumbres y cereales integrales.  Utilizar mtodos de coccin a baja temperatura, como hornear, en lugar de mtodos de coccin a alta temperatura, como frer en abundante aceite. Trabaje con el nutricionista para aprender a Financial planner nutricional de las etiquetas de los alimentos. CMO PUEDEN AFECTARME LOS ALIMENTOS? Carbohidratos Los carbohidratos afectan el nivel de glucosa en sangre ms que cualquier otro tipo de alimento. El nutricionista lo ayudar a Teacher, adult education cuntos carbohidratos puede consumir en cada comida y ensearle a contarlos. El recuento de carbohidratos es importante para mantener la glucosa en sangre en un nivel saludable, en especial si utiliza insulina o toma determinados medicamentos para la diabetes mellitus. Alcohol El alcohol puede provocar disminuciones sbitas de la glucosa en sangre (hipoglucemia), en especial si utiliza insulina o toma determinados medicamentos para la diabetes mellitus. La  hipoglucemia es una afeccin que puede poner en peligro la vida. Los sntomas de la hipoglucemia (somnolencia, mareos y Data processing manager) son similares a los sntomas de haber consumido mucho alcohol.  Si el mdico lo autoriza a beber alcohol, hgalo con moderacin y siga estas pautas:  Las mujeres no deben beber ms de un trago por da, y los hombres no deben beber ms de dos tragos por Training and development officer. Un trago es igual a:  12 onzas (355 ml) de cerveza  5 onzas de vino (150 ml) de vino  1,5onzas (23m) de bebidas espirituosas  No beba con el estmago vaco.  Mantngase hidratado. Beba agua, gaseosas dietticas o t helado sin azcar.  Las gaseosas comunes, los jugos y otros refrescos podran contener muchos carbohidratos y se dCivil Service fast streamer QU ALIMENTOS NO SE RECOMIENDAN? Cuando haga las elecciones de alimentos, es importante que recuerde que todos los alimentos son distintos. Algunos tienen menos nutrientes que otros por porcin, aunque podran tener la misma cantidad de caloras o carbohidratos. Es difcil darle al cuerpo lo que necesita cuando consume alimentos con menos nutrientes. Estos son algunos ejemplos de alimentos que debera evitar ya que contienen muchas caloras y carbohidratos, pero pocos nutrientes:  GPhysicist, medicaltrans (la mayora de los alimentos procesados incluyen grasas trans en la etiqueta de Informacin nutricional).  Gaseosas comunes.  Jugos.  Caramelos.  Dulces, como tortas, pasteles, rosquillas y g[place]  Comidas fritas. QU ALIMENTOS PUEDO COMER? Consuma alimentos ricos en nutrientes, que nutrirn el cuerpo y lo mantendrn saludable. Los alimentos que debe comer tambin dependern de varios factores, como:  Las caloras que necesita.  Los medicamentos que toma.  Su peso.  El nivel de glucosa en sMarist College  El nArrow Rockde presin arterial.  El nivel de colesterol.  Debe consumir una amplia variedad de alimentos, por ejemplo:  Protenas.  Cortes de [employer].  Protenas con bajo contenido de grasas saturadas, como pescado, clara de huevo y frijoles. Evite las carnes procesadas.  Frutas y vegetales.  Frutas y Sports administrator que pueden ayudar a Chief Operating Officer los niveles sanguneos de [place], como [place], mangos y batatas.  Productos lcteos.  Elija productos lcteos sin grasa o con bajo contenido de [place], como [place], yogur y [place].  Cereales, panes, pastas y arroz.  Elija cereales integrales, como panes multicereales, avena en grano y arroz integral. Estos alimentos pueden ayudar a controlar la presin arterial.  Rosalin Hawking.  Alimentos que contengan grasas saludables, como frutos secos, Chartered certified accountant, aceite de [place], aceite de canola y pescado. TODOS LOS QUE PADECEN DIABETES MELLITUS TIENEN EL MISMO PLAN DE COMIDAS? Dado que todas las personas que padecen diabetes mellitus son distintas, no hay un solo plan de comidas que funcione para todos. Es muy importante que se rena con un nutricionista que lo ayudar a crear un plan de comidas adecuado para usted.   Esta informacin no tiene Theme park manager el consejo del mdico. Asegrese de hacerle al mdico cualquier pregunta que tenga.   Document Released: 08/05/2007 Document Revised: 05/19/2014 Elsevier Interactive Patient Education 2016 [employer]. Dieta restringida en grasas y colesterol (Fat and Cholesterol Restricted Diet) El exceso de grasas y colesterol en la dieta puede causar problemas de [place]. Esta dieta lo ayudar a Pharmacologist las grasas y Print production planner en los niveles normales para evitar enfermarse. QU TIPOS DE GRASAS DEBO ELEGIR?  Elija grasas monosaturadas y polinsaturadas. Estas se encuentran en alimentos como el aceite de oliva, aceite de canola, semillas de lino, nueces, almendras y semillas.  Consuma ms grasas omega-3. Las mejores opciones incluyen salmn, caballa, sardinas, atn, aceite de lino y semillas de lino molidas.  Limite el consumo de grasas saturadas, que se  encuentran en productos de origen animal, como carnes, mantequilla y crema. Tambin pueden estar en productos vegetales, como aceite de palma, de palmiste y de coco.   Evite los alimentos con aceites parcialmente hidrogenados. Estos contienen grasas trans. Entre los ejemplos de alimentos con grasas trans se incluyen margarinas en barra, algunas margarinas untables, galletas dulces o saladas y otros productos horneados. QU PAUTAS GENERALES DEBO SEGUIR?   Lea las etiquetas de los alimentos. Busque las palabras "grasas trans" y "grasas saturadas".  Al preparar una comida:  Llene la mitad del plato con verduras y ensaladas de hojas verdes.  Llene un cuarto del plato con cereales integrales. Busque la palabra "integral" en Estate agent de la lista de ingredientes.  Llene un cuarto del plato con alimentos con protenas magras.  Limite las frutas a dos porciones por da. Elija frutas en lugar de jugos.  Coma ms alimentos con fibra soluble, por ejemplo, manzanas, brcoli, zanahorias, frijoles, guisantes y [place]. Trate de consumir de 20a 30g (gramos) de Firefighter.  Coma ms comidas caseras. Coma menos en los restaurantes y los bares.  Limite o evite el alcohol.  Limite los alimentos con alto contenido de almidn y International aid/development worker.  Limite el consumo de alimentos fritos.  Cocine los alimentos sin frerlos. Las opciones de coccin ms [place] son Development worker, community, Regulatory affairs officer, Software engineer y asar a Patent attorney.  Baje de peso si es necesario. Aunque pierda [employer], esto puede ser importante para la salud general. Tambin puede ayudar a prevenir enfermedades como diabetes y enfermedad cardaca. QU ALIMENTOS PUEDO COMER? Cereales Cereales integrales, como los panes  de salvado o integrales, las galletas, los cereales y las pastas. Avena sin endulzar, trigo, [place], quinua o arroz integral. Tortillas de harina de maz o de salvado. Verduras Verduras frescas o congeladas (crudas, al vapor, asadas o  grilladas). Ensaladas de hojas verdes. Nils Pyle Nils Pyle frescas, en conserva (en su jugo natural) o frutas congeladas. Carnes y otros productos con protenas Carne de res molida (al 85% o ms [place]), carne de res de animales alimentados con pastos o carne de res sin la grasa. Pollo o pavo sin piel. Carne de pollo o de [place]. Cerdo sin la grasa. Todos los pescados y frutos de mar. Huevos. Porotos, guisantes o lentejas secos. Frutos secos o semillas sin sal. Frijoles secos o en lata sin sal. Lcteos Productos lcteos con bajo contenido de grasas, como [place] o al 1%, quesos reducidos en grasas o al 2%, ricota con bajo contenido de grasas o [employer], o yogur natural con bajo contenido de [place]. Grasas y Hershey Company untables que no contengan grasas trans. [place] y condimentos para ensaladas livianos o reducidos en grasas. [place]. Aceites de oliva, canola, ssamo o crtamo. Mantequilla natural de cacahuate o almendra (elija la que no tenga agregado de aceite o azcar). Los artculos mencionados arriba pueden no ser Raytheon de las bebidas o los alimentos recomendados. Comunquese con el nutricionista para conocer ms opciones. QU ALIMENTOS NO SE RECOMIENDAN? Cereales Pan blanco. Pastas blancas. Arroz blanco. Pan de maz. Bagels, pasteles y croissants. Galletas saladas que contengan grasas trans. Verduras Papas blancas. MazHoover Brunette con crema o fritas. Verduras en salsa de [place]. Nils Pyle Frutas secas. Fruta enlatada en almbar liviano o espeso. Jugo de frutas. Carnes y otros productos con protenas Cortes de carne con Holiday representative. Costillas, alas de pollo, tocineta, salchicha, mortadela, salame, chinchulines, tocino, perros calientes, salchichas alemanas y embutidos envasados. Hgado y otros rganos. Lcteos Leche entera o al 2%, crema, mezcla de [place] y crema, y queso crema. Quesos enteros. Yogur entero o endulzado. Quesos con toda su grasa. Cremas no lcteas y  coberturas batidas. Quesos procesados, quesos para untar o cuajadas. Dulces y postres Jarabe de maz, azcares, miel y Radio broadcast assistant. Caramelos. Mermelada y [place]. Doreen Beam. Cereales endulzados. Galletas, pasteles, bizcochuelos, donas, muffins y helado. Grasas y [address], [place] en barra, [place] de [place], [place], [place] clarificada o grasa de tocino. Aceites de coco, de palmiste o de palma. Bebidas Alcohol. Bebidas endulzadas (como refrescos, limonadas y bebidas frutales o ponches). Los artculos mencionados arriba pueden no ser Raytheon de las bebidas y los alimentos que se Theatre stage manager. Comunquese con el nutricionista para obtener ms informacin.   Esta informacin no tiene Theme park manager el consejo del mdico. Asegrese de hacerle al mdico cualquier pregunta que tenga.   Document Released: 10/28/2011 Document Revised: 05/19/2014 Elsevier Interactive Patient Education [employer]

## 2015-07-03 NOTE — Assessment & Plan Note (Signed)
Calculated ASCVD 10 year risk 2.2% No indication for statin at this time Advised on diet and exercise lifestyle modifications Follow-up in 3 months

## 2015-07-03 NOTE — Assessment & Plan Note (Addendum)
Lengthy discussion about healthy diet and exercise

## 2015-07-03 NOTE — Progress Notes (Signed)
   Subjective:   Rachel Fletcher is a 50 y.o. female with no known medical problems in the setting of not seeing a doctor for many years here for wisewoman follow-up for elevated cholesterol and A1c  HLD - was recently checked by [facility] program - she was told it was elevated and to have it checked - diet: balanced diet, likes chicken, fish, used to drink a lot of soda, but quitting it and drinking lemonade with light sugar - eats a lot of rice and sphagetti, likes fried foods a lot too, likes chinese food - exercise: none, used to walk  T2DM - was recently checked by [employer] program - A1c 6.5 - patient reports she has never before been diagnosed with diabetes - she does have family history of T2DM - sister with DM - see diet and exercise above - has had intermittent headaches - intermittent blurry vision as well - has intermittent pain in b/l feet recently - pins and needles and burning - a little, not much  Review of Systems:  Per HPI.   Social History: former smoker - quit 5 years ago  Objective:  BP 113/75 mmHg  Pulse 77  Temp(Src) 97.6 F (36.4 C) (Oral)  Ht  (1.6 m)  Wt 162 lb (73.483 kg)  BMI 28.70 kg/m2  Gen:  50 y.o. female in NAD HEENT: NCAT, MMM, EOMI, PERRL, anicteric sclerae CV: RRR, no MRG Resp: Non-labored, CTAB, no wheezes noted Abd: Soft, NTND, BS present, no guarding or organomegaly Ext: WWP, no edema MSK: No obvious deformities Neuro: Alert and oriented, speech normal  Diabetic Foot Check -  Appearance - no lesions, ulcers or calluses Skin - no unusual pallor or redness Monofilament testing -  Right - Great toe, medial, central, lateral ball and posterior foot intact Left - Great toe, medial, central, lateral ball and posterior foot intact      Assessment & Plan:     Rachel Fletcher is a 50 y.o. female here for HLD and T2 DM follow-up   T2DM (type 2 diabetes mellitus) (HCC) A1c 6.5 No evidence of diabetic  foot complications Advised diet and exercise lifestyle modifications Follow-up in 3 months  Overweight Lengthy discussion about healthy diet and exercise  HLD (hyperlipidemia) Calculated ASCVD 10 year risk 2.2% No indication for statin at this time Advised on diet and exercise lifestyle modifications Follow-up in 3 months      also advised patient to make appointment financial counselor to possibly get the orange card or other financial assistance. Patient will then establish care with our clinic for further management of chronic conditions    Erasmo Downer, MD MPH PGY-2,  [facility] 07/03/2015  4:46 PM

## 2015-07-05 ENCOUNTER — Ambulatory Visit: Payer: Self-pay

## 2015-07-05 DIAGNOSIS — Z789 Other specified health status: Secondary | ICD-10-CM

## 2015-07-05 NOTE — Patient Instructions (Signed)
Patient will decrease carbs to five to 7 servings a day. Will use measuring cup and/or plate for portion sizes. Will cut out sugars over the next couple of months. Will increase activity by walking, Verbalized Ubderstanding

## 2015-07-05 NOTE — Progress Notes (Signed)
Patient returns today for further risk reduction health coaching; following risk reduction counseling on telephone and doctor's appointment related to A1C value of 6.5 with interpreter Delorise Royals.  RISK REDUCTION COUNSELING REVIEW: Reviewed patients results on flow sheet with normal values and information in Spanish. Looked at BMI and what was normal for her height. Patient stated that since doctor's visit had cut down on amount of fruit juice drinks and sugar. Patient stated that feels better.  HEALTH COACHING MAIN TOPICS:  NUTRITION: When asked patient stated that mainly wanted to learn? Patient stated that wanted to know what should eat and what should not eat. Went over food groups and how any carbohydrates were in a serving of each food group. Discussed serving sizes and located them on handout. Wrote on handout how many servings of each food group she could have a day. Demonstrated the number of servings of starches a day by what were suppose to eat. Patient stated that she would starve. Discussed healthy snack that could have. Discussed the difference between fat calories in products and sugar. Informed patient that needed to look at food labels and not eat foods that have more than 5 GMS sugar per serving. Discussed importance of fiber in and added sugars in food. Looked over types of sugar listing in labels and artificial sugars. Patient received two handouts with serving sizes and carbs for different foods. Went over and received a My Plate for serving sizes and pamphlet 8 major ways to control cholesterol..Also received  measuring cup, Snack dish,and bag.  NAVIGATION NEEDS ASSESSMENT AND CARE PLAN Follow up: Patient stated that had appointment for orange card on March 7. Stated was fine right now. Informed patient that she could call me and we would call he in one month.  PLAN: Forty five minute health coaching. Will call and follow up in a month. Loss weight and reduce all lab values to within  normal value in one year.

## 2015-07-17 ENCOUNTER — Ambulatory Visit: Payer: Self-pay

## 2015-09-03 ENCOUNTER — Telehealth: Payer: Self-pay

## 2015-09-03 NOTE — Telephone Encounter (Addendum)
[facility] Patient was called per phone today for Health Coaching by interpreter [name]. Patient's areas of concern were weight, cholesterol, A1C and exercise.  HEALTH COACHING: Patient stated had stopped drinking drinks with sugars. Per patient is generally feeling better but having some stomach pains.Patient states that is walking everyday. Patient stated has gotten her eligibility. Patient stated that would like to come back and do some one on one coaching in person. Appointment scheduled for 4/27.  PLAN: Will come for health coaching on 09/06/15. Will continue with increasing exercise. Will continue with walking and nutrition changes.Will discuss next time needs to go to doctor.

## 2015-09-07 ENCOUNTER — Ambulatory Visit: Payer: Self-pay

## 2015-09-07 DIAGNOSIS — Z789 Other specified health status: Secondary | ICD-10-CM

## 2015-09-07 NOTE — Progress Notes (Signed)
[facility] Patient wanted to come back in today for more Health Coaching with interpreter [name] present. Patient's areas of concern are what to eat and exercise.  HEALTH COACHING: Patient stated has decreased the amount of sugar in coffee to one sugar. Per patient ate flour tortilla, with cheese and vegetables for breakfast. Asked patient about fiber in diet and how could she increase fiber in diet. Wheat or fiber tortillas and fresh vegetables like Cauliflower, broccolli and other fresh fruits and vegetables. Discussed about artifical sweeteners especially Stevia which is natural. Discussed doctors appointment and patient stated needed to make appointment. Also, talked about taking in taxes for orange card. Patient discussed what likes to eat and I stressed to remember portion control and measuring.Note book given with handouts and received activity book.  PLAN: Will Call in 3 to 4 weeks. Will continue with increasing exercise. Will continue with the good meal and nutrition changes. Will be doing follow up assessment when call next.  TIME: 35 minutes

## 2015-09-07 NOTE — Patient Instructions (Signed)
Will make doctors appointment for follow up. Will increase amount of exercise does per week. Will read labels and increase fiber in diet.

## 2015-09-25 ENCOUNTER — Telehealth: Payer: Self-pay

## 2015-09-25 NOTE — Telephone Encounter (Signed)
Patient called per [name] interpreter [facility].  ASSESSMENT :This is a follow up Assessment following [facility] . Marland Kitchen.  Medication Status : Patient states is not taking medication for high cholesterol, hypertension, or diabetes.   Blood Pressure, Self-measurement: Patient states has no reason to check Blood pressure.  Nutrition Assessment: Patient stated that eats 2 fruist every day. Patient states she eats 1 serving of vegetables a day. Per Patient eats 3 or more ounces of whole grains daily. Patient stated does eat two or more servings of fish weekly.  Patient states she does not drink more than 36 ounces or 450 calories of beverages with added sugars weekly. Patient stated she does watch her salt intake.   Physical Activity Assessment: Patient stated she does around 210 minutes of moderate activity and 20 minutes vigorous per week.  Smoking Status: Patient stated has never smoked and is not exposed to smoke.  Quality of Life Assessment: In assessing patient's Physical quality of life she stated that out of the past 30 days that she has felt her health is good all of them. Patient also stated that in the past 30 days that her mental health was good including stress, depression and problems with emotions for all days. Patient did state that out of the past 30 days she felt her physical or mental health had not kept her from doing her usual activities including self-care, work or recreation.   PLAN: Informed patient that would re screen if does not have insurance when and if [facility]  TIME SPENT with PATIENT: 15 minutes

## 2015-10-19 ENCOUNTER — Ambulatory Visit: Payer: Self-pay | Admitting: Internal Medicine

## 2016-06-05 ENCOUNTER — Other Ambulatory Visit: Payer: Self-pay | Admitting: Obstetrics and Gynecology

## 2016-06-05 DIAGNOSIS — Z1231 Encounter for screening mammogram for malignant neoplasm of breast: Secondary | ICD-10-CM

## 2017-02-16 ENCOUNTER — Encounter (HOSPITAL_COMMUNITY): Payer: Self-pay

## 2017-05-20 ENCOUNTER — Encounter (HOSPITAL_COMMUNITY): Payer: Self-pay

## 2018-07-13 ENCOUNTER — Ambulatory Visit (HOSPITAL_COMMUNITY)
Admission: EM | Admit: 2018-07-13 | Discharge: 2018-07-13 | Disposition: A | Discharge status: Home / Self Care | Payer: No Typology Code available for payment source | Attending: Family Medicine | Admitting: Family Medicine

## 2018-07-13 ENCOUNTER — Encounter (HOSPITAL_COMMUNITY): Payer: Self-pay | Admitting: Emergency Medicine

## 2018-07-13 DIAGNOSIS — R42 Dizziness and giddiness: Secondary | ICD-10-CM

## 2018-07-13 DIAGNOSIS — R102 Pelvic and perineal pain: Secondary | ICD-10-CM

## 2018-07-13 DIAGNOSIS — R1030 Lower abdominal pain, unspecified: Secondary | ICD-10-CM

## 2018-07-13 LAB — GLUCOSE, CAPILLARY: Glucose-Capillary: 78 mg/dL (ref 70–99)

## 2018-07-13 LAB — POCT URINALYSIS DIP (DEVICE)
Bilirubin Urine: NEGATIVE
Glucose, UA: NEGATIVE mg/dL
Hgb urine dipstick: NEGATIVE
Ketones, ur: NEGATIVE mg/dL
Leukocytes,Ua: NEGATIVE
NITRITE: NEGATIVE
PROTEIN: NEGATIVE mg/dL
Specific Gravity, Urine: 1.02 (ref 1.005–1.030)
Urobilinogen, UA: 0.2 mg/dL (ref 0.0–1.0)
pH: 7 (ref 5.0–8.0)

## 2018-07-13 MED ORDER — PSYLLIUM 58.6 % PO PACK: 1.0000 | PACK | Freq: Every day | ORAL | 1 refills | Status: DC

## 2018-07-13 MED ORDER — MECLIZINE HCL 12.5 MG PO TABS: 12.5000 mg | ORAL_TABLET | Freq: Three times a day (TID) | ORAL | 0 refills | Status: DC | PRN

## 2018-07-13 NOTE — ED Triage Notes (Signed)
Per daughter, states "it sounds like I have water inside my stomach, I feel a pain in my pelvic area". Pt state sometimes she feels dizzy. Dizziness x3 months. Pelvic pain x2 weeks.

## 2018-07-13 NOTE — Discharge Instructions (Addendum)
Continue eating healthy diet Drink plenty of water Take Metamucil once a day This will help regulate your bowels and reduce your abdominal pain Take meclizine as needed for dizziness  Follow-up with your primary care doctor for prevention, and for your regular medical treatment

## 2018-07-13 NOTE — ED Notes (Signed)
Pt roomed in Room 5.  Pt is not able to give [place] a urine sample at this time.  Pt given a cup of water.

## 2018-07-13 NOTE — ED Provider Notes (Signed)
[facility]    CSN: [id number] Arrival date & time: 07/13/18  1043     History   Chief Complaint Chief Complaint  Patient presents with  . Pelvic Pain  . Dizziness    HPI Rachel Fletcher is a 53 y.o. female.   HPI  Patient is here for lower abdominal pain.  She states her stomach feels like it has water in it.  I think she means is distended.  She states she sometimes gets pain in her pelvic area.  She has had a hysterectomy.  No urinary frequency, dysuria, or bladder symptoms.  She states that she does have irregular bowel habits.  She states that sometimes she feels dizzy.  The dizziness is been going on for 3 months.  No fever chills.  No nausea vomiting.  No change in her weight.  She is currently not under the care of a medical doctor.  When last she was evaluated, 2 to 3 years ago.  She was found to be prediabetic.  History reviewed. No pertinent past medical history.  Patient Active Problem List   Diagnosis Date Noted  . Overweight 07/03/2015  . T2DM (type 2 diabetes mellitus) (HCC) 07/03/2015  . HLD (hyperlipidemia) 07/03/2015  . Family history of diabetes mellitus 01/26/2012    Past Surgical History:  Procedure Laterality Date  . resection of submucous prolapsed myoma    . VAGINAL HYSTERECTOMY     TVH    OB History    Gravida  5   Para  2   Term      Preterm      AB  3   Living  2     SAB  3   TAB      Ectopic      Multiple      Live Births               Home Medications    Prior to Admission medications   Medication Sig Start Date End Date Taking? Authorizing Provider  b complex vitamins capsule Take 1 capsule by mouth daily.   Yes [provider]  CALCIUM PO Take by mouth.    [provider]  meclizine (ANTIVERT) 12.5 MG tablet Take 1-2 tablets (12.5-25 mg total) by mouth 3 (three) times daily as needed for dizziness. 07/13/18   Eustace Moore, MD  psyllium (METAMUCIL) 58.6 % packet Take  1 packet by mouth daily. 07/13/18   Eustace Moore, MD    Family History Family History  Problem Relation Age of Onset  . Diabetes Sister     Social History Social History   Tobacco Use  . Smoking status: Former Smoker    Last attempt to quit: 05/12/2010    Years since quitting: 8.1  . Smokeless tobacco: Never Used  Substance Use Topics  . Alcohol use: No  . Drug use: No     Allergies   Patient has no known allergies.   Review of Systems Review of Systems  Constitutional: Negative for chills and fever.  HENT: Negative for ear pain and sore throat.   Eyes: Negative for pain and visual disturbance.  Respiratory: Negative for cough and shortness of breath.   Cardiovascular: Negative for chest pain and palpitations.  Gastrointestinal: Positive for abdominal pain. Negative for vomiting.  Genitourinary: Negative for dysuria and hematuria.  Musculoskeletal: Negative for arthralgias and back pain.  Skin: Negative for color change and rash.  Neurological: Positive for dizziness. Negative for seizures  and syncope.  All other systems reviewed and are negative.    Physical Exam Triage Vital Signs ED Triage Vitals  Enc Vitals Group     BP 07/13/18 1124 116/77     Pulse Rate 07/13/18 1124 73     Resp 07/13/18 1124 16     Temp 07/13/18 1124 98 F (36.7 C)     Temp src --      SpO2 07/13/18 1124 100 %     Weight --      Height --      Head Circumference --      Peak Flow --      Pain Score 07/13/18 1126 3     Pain Loc --      Pain Edu? --      Excl. in GC? --    No data found.  Updated Vital Signs BP 116/77   Pulse 73   Temp 98 F (36.7 C)   Resp 16   SpO2 100%       Physical Exam Constitutional:      General: She is not in acute distress.    Appearance: She is well-developed.  HENT:     Head: Normocephalic and atraumatic.     Right Ear: Tympanic membrane and ear canal normal.     Left Ear: Tympanic membrane and ear canal normal.     Nose: Nose  normal.     Mouth/Throat:     Mouth: Mucous membranes are moist.  Eyes:     Conjunctiva/sclera: Conjunctivae normal.     Pupils: Pupils are equal, round, and reactive to light.     Comments: No nystagmus  Neck:     Musculoskeletal: Normal range of motion and neck supple.  Cardiovascular:     Rate and Rhythm: Normal rate.     Heart sounds: Normal heart sounds.  Pulmonary:     Effort: Pulmonary effort is normal. No respiratory distress.     Breath sounds: Normal breath sounds.  Abdominal:     General: Abdomen is flat. Bowel sounds are normal. There is no distension.     Palpations: Abdomen is soft.     Comments: No tenderness throughout.  No organomegaly  Musculoskeletal: Normal range of motion.  Skin:    General: Skin is warm and dry.  Neurological:     Mental Status: She is alert.      UC Treatments / Results  Labs (all labs ordered are listed, but only abnormal results are displayed) Labs Reviewed  GLUCOSE, CAPILLARY  POCT URINALYSIS DIP (DEVICE)  CBG MONITORING, ED    EKG None  Radiology No results found.  Procedures Procedures (including critical care time)  Medications Ordered in UC Medications - No data to display  Initial Impression / Assessment and Plan / UC Course  I have reviewed the triage vital signs and the nursing notes.  Pertinent labs & imaging results that were available during my care of the patient were reviewed by me and considered in my medical decision making (see chart for details).     I feel like the patient has some bowel irregularity causing her abdominal pain.  She has no urinary symptoms, her urine is normal.  She has had a hysterectomy.  (See if a trial of Metamucil makes her feel better.  Her blood sugar is great. Dizziness is a chronic problem for many months.  I will give her as needed meclizine.  Her examination is normal.  She may have a Mnire's  disease problem Final Clinical Impressions(s) / UC Diagnoses   Final  diagnoses:  Dizziness  Lower abdominal pain     Discharge Instructions     Continue eating healthy diet Drink plenty of water Take Metamucil once a day This will help regulate your bowels and reduce your abdominal pain Take meclizine as needed for dizziness  Follow-up with your primary care doctor for prevention, and for your regular medical treatment   ED Prescriptions    Medication Sig Dispense Auth. Provider   meclizine (ANTIVERT) 12.5 MG tablet Take 1-2 tablets (12.5-25 mg total) by mouth 3 (three) times daily as needed for dizziness. 45 tablet Eustace Moore, MD   psyllium (METAMUCIL) 58.6 % packet Take 1 packet by mouth daily. 30 each Eustace Moore, MD     Controlled Substance Prescriptions [place] Controlled Substance Registry consulted? Not Applicable   Eustace Moore, MD 07/13/18 2213

## 2018-11-26 ENCOUNTER — Other Ambulatory Visit: Payer: Self-pay

## 2018-11-26 ENCOUNTER — Ambulatory Visit (HOSPITAL_COMMUNITY)
Admission: EM | Admit: 2018-11-26 | Discharge: 2018-11-26 | Disposition: A | Discharge status: Home / Self Care | Payer: Self-pay | Attending: Emergency Medicine | Admitting: Emergency Medicine

## 2018-11-26 ENCOUNTER — Encounter (HOSPITAL_COMMUNITY): Payer: Self-pay

## 2018-11-26 DIAGNOSIS — R21 Rash and other nonspecific skin eruption: Secondary | ICD-10-CM

## 2018-11-26 MED ORDER — PREDNISONE 10 MG (21) PO TBPK: ORAL_TABLET | Freq: Every day | ORAL | 0 refills | Status: DC

## 2018-11-26 NOTE — Discharge Instructions (Signed)
Take the prednisone as prescribed.  You can also take Benadryl as needed for the itching.    Return here or go to the emergency department if your rash worsens or if you develop fever, chills, sore throat, shortness of breath, cough, abdominal pain, vomiting, diarrhea, or other concerning symptoms.

## 2018-11-26 NOTE — ED Provider Notes (Signed)
[facility]    CSN: [id number] Arrival date & time: 11/26/18  1054     History   Chief Complaint Chief Complaint  Patient presents with  . Rash    HPI Rachel Fletcher is a 53 y.o. female.   Patient presents with pruritic rash on her trunk x3 months.  She denies new medications or detergents.  She denies fever, chills, sore throat, difficulty swallowing, shortness of breath, cough, abdominal pain, vomiting, diarrhea, other symptoms.  She denies other similar rash in family members in the home.  LMP: hysterectomy.    The history is provided by the patient. A language interpreter was used.    History reviewed. No pertinent past medical history.  Patient Active Problem List   Diagnosis Date Noted  . Overweight 07/03/2015  . T2DM (type 2 diabetes mellitus) (HCC) 07/03/2015  . HLD (hyperlipidemia) 07/03/2015  . Family history of diabetes mellitus 01/26/2012    Past Surgical History:  Procedure Laterality Date  . resection of submucous prolapsed myoma    . VAGINAL HYSTERECTOMY     TVH    OB History    Gravida  5   Para  2   Term      Preterm      AB  3   Living  2     SAB  3   TAB      Ectopic      Multiple      Live Births               Home Medications    Prior to Admission medications   Medication Sig Start Date End Date Taking? Authorizing Provider  b complex vitamins capsule Take 1 capsule by mouth daily.   Yes [provider]  CALCIUM PO Take by mouth.   Yes [provider]  meclizine (ANTIVERT) 12.5 MG tablet Take 1-2 tablets (12.5-25 mg total) by mouth 3 (three) times daily as needed for dizziness. 07/13/18   Eustace MooreNelson, Yvonne Sue, MD  predniSONE (STERAPRED UNI-PAK 21 TAB) 10 MG (21) TBPK tablet Take by mouth daily. Take 6 tabs by mouth daily  for 1 day, then 5 tabs for 1 day, then 4 tabs for 1 day, then 3 tabs for 1 day, 2 tabs for 1 day, then 1 tab by mouth daily for 1 day 11/26/18   Mickie Bailate, Jariah Jarmon H, NP   psyllium (METAMUCIL) 58.6 % packet Take 1 packet by mouth daily. 07/13/18   Eustace MooreNelson, Yvonne Sue, MD    Family History Family History  Problem Relation Age of Onset  . Diabetes Sister   . Healthy Mother   . Healthy Father     Social History Social History   Tobacco Use  . Smoking status: Former Smoker    Quit date: 05/12/2010    Years since quitting: 8.5  . Smokeless tobacco: Never Used  Substance Use Topics  . Alcohol use: No  . Drug use: No     Allergies   Patient has no known allergies.   Review of Systems Review of Systems  Constitutional: Negative for chills and fever.  HENT: Negative for ear pain and sore throat.   Eyes: Negative for pain and visual disturbance.  Respiratory: Negative for cough and shortness of breath.   Cardiovascular: Negative for chest pain and palpitations.  Gastrointestinal: Negative for abdominal pain, diarrhea and vomiting.  Genitourinary: Negative for dysuria and hematuria.  Musculoskeletal: Negative for arthralgias and back pain.  Skin: Positive for rash.  Negative for color change.  Neurological: Negative for seizures and syncope.  All other systems reviewed and are negative.    Physical Exam Triage Vital Signs ED Triage Vitals  Enc Vitals Group     BP 11/26/18 1201 137/90     Pulse Rate 11/26/18 1201 81     Resp 11/26/18 1201 16     Temp 11/26/18 1201 98.2 F (36.8 C)     Temp Source 11/26/18 1201 Oral     SpO2 11/26/18 1201 100 %     Weight --      Height --      Head Circumference --      Peak Flow --      Pain Score 11/26/18 1156 0     Pain Loc --      Pain Edu? --      Excl. in GC? --    No data found.  Updated Vital Signs BP 137/90 (BP Location: Right Arm)   Pulse 81   Temp 98.2 F (36.8 C) (Oral)   Resp 16   SpO2 100%   Visual Acuity Right Eye Distance:   Left Eye Distance:   Bilateral Distance:    Right Eye Near:   Left Eye Near:    Bilateral Near:     Physical Exam Vitals signs and nursing note  reviewed.  Constitutional:      General: She is not in acute distress.    Appearance: She is well-developed.  HENT:     Head: Normocephalic and atraumatic.     Right Ear: Tympanic membrane normal.     Left Ear: Tympanic membrane normal.     Mouth/Throat:     Mouth: Mucous membranes are moist.     Pharynx: Oropharynx is clear.  Eyes:     Conjunctiva/sclera: Conjunctivae normal.  Neck:     Musculoskeletal: Neck supple.  Cardiovascular:     Rate and Rhythm: Normal rate and regular rhythm.     Heart sounds: No murmur.  Pulmonary:     Effort: Pulmonary effort is normal. No respiratory distress.     Breath sounds: Normal breath sounds.  Abdominal:     Palpations: Abdomen is soft.     Tenderness: There is no abdominal tenderness. There is no guarding or rebound.  Musculoskeletal: Normal range of motion.        General: No swelling, tenderness or signs of injury.  Skin:    General: Skin is warm and dry.     Comments: Flesh-colored maculopapular rash on back and abdomen.  No erythema or drainage.  Neurological:     Mental Status: She is alert.      UC Treatments / Results  Labs (all labs ordered are listed, but only abnormal results are displayed) Labs Reviewed - No data to display  EKG   Radiology No results found.  Procedures Procedures (including critical care time)  Medications Ordered in UC Medications - No data to display  Initial Impression / Assessment and Plan / UC Course  I have reviewed the triage vital signs and the nursing notes.  Pertinent labs & imaging results that were available during my care of the patient were reviewed by me and considered in my medical decision making (see chart for details).   Rash.  Treating today with prednisone pack x6 days and Benadryl as needed.  This with patient that she should return here or go to the emergency department if her rash worsens or if she develops fever, chills, sore  throat, shortness of breath, cough,  abdominal pain, vomiting, diarrhea, other concerning symptoms.     Final Clinical Impressions(s) / UC Diagnoses   Final diagnoses:  Rash     Discharge Instructions     Take the prednisone as prescribed.  You can also take Benadryl as needed for the itching.    Return here or go to the emergency department if your rash worsens or if you develop fever, chills, sore throat, shortness of breath, cough, abdominal pain, vomiting, diarrhea, or other concerning symptoms.        ED Prescriptions    Medication Sig Dispense Auth. Provider   predniSONE (STERAPRED UNI-PAK 21 TAB) 10 MG (21) TBPK tablet Take by mouth daily. Take 6 tabs by mouth daily  for 1 day, then 5 tabs for 1 day, then 4 tabs for 1 day, then 3 tabs for 1 day, 2 tabs for 1 day, then 1 tab by mouth daily for 1 day 21 tablet Sharion Balloon, NP     Controlled Substance Prescriptions [place] Controlled Substance Registry consulted? Not Applicable   Sharion Balloon, NP 11/26/18 1320

## 2018-11-26 NOTE — ED Triage Notes (Signed)
Patient presents to Urgent Care with complaints of spots on her body like "pimples". Patient reports they are itchy sometimes and she has some old spots but the recent ones are three months old.

## 2019-06-03 ENCOUNTER — Other Ambulatory Visit: Payer: Self-pay

## 2019-06-03 ENCOUNTER — Encounter (HOSPITAL_COMMUNITY): Payer: Self-pay | Admitting: Emergency Medicine

## 2019-06-03 ENCOUNTER — Emergency Department (HOSPITAL_COMMUNITY)
Admission: EM | Admit: 2019-06-03 | Discharge: 2019-06-03 | Disposition: A | Discharge status: Home / Self Care | Payer: No Typology Code available for payment source | Attending: Emergency Medicine | Admitting: Emergency Medicine

## 2019-06-03 DIAGNOSIS — Z79899 Other long term (current) drug therapy: Secondary | ICD-10-CM | POA: Insufficient documentation

## 2019-06-03 DIAGNOSIS — M25511 Pain in right shoulder: Secondary | ICD-10-CM | POA: Insufficient documentation

## 2019-06-03 DIAGNOSIS — Z87891 Personal history of nicotine dependence: Secondary | ICD-10-CM | POA: Insufficient documentation

## 2019-06-03 DIAGNOSIS — E119 Type 2 diabetes mellitus without complications: Secondary | ICD-10-CM | POA: Insufficient documentation

## 2019-06-03 DIAGNOSIS — M5489 Other dorsalgia: Secondary | ICD-10-CM | POA: Insufficient documentation

## 2019-06-03 DIAGNOSIS — R1030 Lower abdominal pain, unspecified: Secondary | ICD-10-CM

## 2019-06-03 LAB — COMPREHENSIVE METABOLIC PANEL
ALT: 23 U/L (ref 0–44)
AST: 25 U/L (ref 15–41)
Albumin: 4.3 g/dL (ref 3.5–5.0)
Alkaline Phosphatase: 57 U/L (ref 38–126)
Anion gap: 12 (ref 5–15)
BUN: 18 mg/dL (ref 6–20)
CO2: 24 mmol/L (ref 22–32)
Calcium: 9.6 mg/dL (ref 8.9–10.3)
Chloride: 101 mmol/L (ref 98–111)
Creatinine, Ser: 0.8 mg/dL (ref 0.44–1.00)
GFR calc Af Amer: >60 mL/min (ref >60)
GFR calc non Af Amer: >60 mL/min (ref >60)
Glucose, Bld: 109 mg/dL — ABNORMAL HIGH (ref 70–99)
Potassium: 3.9 mmol/L (ref 3.5–5.1)
Sodium: 137 mmol/L (ref 135–145)
Total Bilirubin: 0.3 mg/dL (ref 0.3–1.2)
Total Protein: 7.5 g/dL (ref 6.5–8.1)

## 2019-06-03 LAB — URINALYSIS, ROUTINE W REFLEX MICROSCOPIC
Bacteria, UA: NONE SEEN
Bilirubin Urine: NEGATIVE
Glucose, UA: NEGATIVE mg/dL
Hgb urine dipstick: NEGATIVE
Ketones, ur: NEGATIVE mg/dL
Nitrite: NEGATIVE
Protein, ur: NEGATIVE mg/dL
Specific Gravity, Urine: 1.025 (ref 1.005–1.030)
pH: 5 (ref 5.0–8.0)

## 2019-06-03 LAB — CBC
HCT: 41.2 % (ref 36.0–46.0)
Hemoglobin: 13.6 g/dL (ref 12.0–15.0)
MCH: 30.4 pg (ref 26.0–34.0)
MCHC: 33 g/dL (ref 30.0–36.0)
MCV: 92.2 fL (ref 80.0–100.0)
Platelets: 270 10*3/uL (ref 150–400)
RBC: 4.47 MIL/uL (ref 3.87–5.11)
RDW: 12.5 % (ref 11.5–15.5)
WBC: 4.7 10*3/uL (ref 4.0–10.5)
nRBC: 0 % (ref 0.0–0.2)

## 2019-06-03 LAB — I-STAT BETA HCG BLOOD, ED (MC, WL, AP ONLY): I-stat hCG, quantitative: <5 m[IU]/mL (ref <5)

## 2019-06-03 LAB — LIPASE, BLOOD: Lipase: 25 U/L (ref 11–51)

## 2019-06-03 MED ORDER — SODIUM CHLORIDE 0.9% FLUSH: 3.0000 mL | Freq: Once | INTRAVENOUS | Status: DC

## 2019-06-03 NOTE — Discharge Instructions (Addendum)
You were seen in the ER for back pain, abdominal pain, right shoulder pain  Labs were normal  The cause of your pain is unclear but I think it is from recent heavy lifting or muscular injury.  Your abdominal pain may be due to gas.  Urinalysis did not show infection but we will send urine to the lab to confirm and rule out an infection in your urine.  If lab confirm infection they will call you to let you know and we will send a prescription for antibiotics to your pharmacy  Alternate ibuprofen or acetaminophen every 8 hours for the next 3 days to see if this helps.  Use simethicone for gas relief.   Nurse of case management made you an appointment on 2/15 at 9:30 am for a primary care doctor appointment, go to your appointment. do not miss this appointment. the clinic will help you apply for orange card.  Return to ER for fever greater than 100, vomiting, diarrhea, constipation, blood in stool, urinary symptoms..Lo vieron en la sala de emergencias por dolor de espalda, dolor abdominal, dolor en el hombro derecho  Usted vino a la sala de emergencias por dolor de espalda, dolor abdominal, dolor en el hombro derecho  [employer] de sangre y Heritage manager resultaron normales  La causa de su dolor no est clara, pero creo que es por levantar objetos pesados o una lesin muscular reciente. Su dolor abdominal puede deberse a gases.  Anlisis de [place] no mostr infeccin en la orina, pero enviaremos orina al laboratorio para Astronomer y Sales promotion account executive una infeccin.  Si el laboratorio confirma la infeccin, lo llamarn para informarle y le enviaremos una receta de antibiticos a su farmacia.  Alterne ibuprofeno o acetaminofn cada 8 horas durante los prximos 3 das para ver si esto ayuda. Use simeticona para aliviar los gases.    La enfermera de manejo de casos le hizo Neomia Dear cita el 2/15 a las 9:30 am para [place] cita con el mdico de atencin primaria, vaya a su cita. no te pierdas esta cita. la clnica le ayudar a  solicitar la tarjeta naranja.  Regrese a la sala de emergencias si tiene fiebre superior a 100C, vmitos, diarrea, estreimiento, [employer], sntomas urinarios

## 2019-06-03 NOTE — ED Triage Notes (Signed)
Patient reports low abdominal pain and mid back pain onset this week , denies emesis or diarrhea , no fever or chills .

## 2019-06-03 NOTE — Discharge Planning (Signed)
Cutler Sunday J. Lucretia Roers, RN, BSN, [place] [phone]  [facility] set up appointment with [facility] on 2/15 @ 0930.  Spoke with pt at bedside via interpreter and advised to please arrive 15 min early and take a picture ID and your current medications.  Pt verbalizes understanding of keeping appointment.

## 2019-06-03 NOTE — ED Notes (Signed)
Pt d/c home per MD order. Discharge summary reviewed with pt- using wallee spanish interpretor. Pt verbalizes understanding. Ambulatory off unit. No s/s of acute distress noted. Pt daughter here for discharge ride home.

## 2019-06-03 NOTE — ED Provider Notes (Addendum)
[facility] EMERGENCY DEPARTMENT Provider Note   CSN: [id number] Arrival date & time: 06/03/19  0331     History Chief Complaint  Patient presents with  . Abdominal Pain  . Back Pain    Rachel Fletcher is a 54 y.o. female presents to the ER for evaluation of pain in her back, lower abdomen and right arm.  Onset 4 days ago.  Also reports a "boom boom" sound in her ears bilaterally for a long time.  Occasional nausea.  Reports right arm pain is located in the right shoulder, right lateral ribs.  This pain has significantly improved and is only intermittent now with certain position changes.  It feels like a "tightness".  No CP or SOB. Her back pain is in the lower and middle back and is described as a "burning" on the surface of the skin.  This pain is also improved and is worse with position changes.  She has intermittent sharp fleeting pains in the right low and left low abdomen/inguinal areas.  This pain is brief, 2/10 and occurs spontaneously and randomly throughout the day.  The pain feels like the pain when she has to have a BM or diarrhea or pass gas, then it goes away.  Currently she denies any pain in her back, shoulder abdomen and states her pains come and go.  Remembers lifting a heavy piece of furniture the day before her symptoms started.  She thinks it is related to the lifting.  No interventions.  No fever, vomiting.  No changes in her bowel movements no constipation or diarrhea.  She typically has a soft BM every 1 to 2 days.  No blood in her stool.  No dysuria, hematuria or urinary frequency.  She gets occasional vaginal itching but uses a cream as needed and it goes away, she is no vaginal discomfort or itching now.  No vaginal discharge or abnormal bleeding.  History of hysterectomy many years ago.  She thinks she still has her ovaries.   HPI     History reviewed. No pertinent past medical history.  Patient Active Problem List   Diagnosis  Date Noted  . Overweight 07/03/2015  . T2DM (type 2 diabetes mellitus) ([place]) 07/03/2015  . HLD (hyperlipidemia) 07/03/2015  . Family history of diabetes mellitus 01/26/2012    Past Surgical History:  Procedure Laterality Date  . resection of submucous prolapsed myoma    . VAGINAL HYSTERECTOMY     TVH     OB History    Gravida  5   Para  2   Term      Preterm      AB  3   Living  2     SAB  3   TAB      Ectopic      Multiple      Live Births              Family History  Problem Relation Age of Onset  . Diabetes Sister   . Healthy Mother   . Healthy Father     Social History   Tobacco Use  . Smoking status: Former Smoker    Quit date: 05/12/2010    Years since quitting: 9.0  . Smokeless tobacco: Never Used  Substance Use Topics  . Alcohol use: No  . Drug use: No    Home Medications Prior to Admission medications   Medication Sig Start Date End Date Taking? Authorizing Provider  b complex vitamins  capsule Take 1 capsule by mouth daily.    [provider]  CALCIUM PO Take by mouth.    [provider]  meclizine (ANTIVERT) 12.5 MG tablet Take 1-2 tablets (12.5-25 mg total) by mouth 3 (three) times daily as needed for dizziness. 07/13/18   Raylene Everts, MD  predniSONE (STERAPRED UNI-PAK 21 TAB) 10 MG (21) TBPK tablet Take by mouth daily. Take 6 tabs by mouth daily  for 1 day, then 5 tabs for 1 day, then 4 tabs for 1 day, then 3 tabs for 1 day, 2 tabs for 1 day, then 1 tab by mouth daily for 1 day 11/26/18   Sharion Balloon, NP  psyllium (METAMUCIL) 58.6 % packet Take 1 packet by mouth daily. 07/13/18   Raylene Everts, MD    Allergies    Patient has no known allergies.  Review of Systems   Review of Systems  Gastrointestinal: Positive for abdominal pain and nausea.  Genitourinary: Positive for pelvic pain.  Musculoskeletal: Positive for arthralgias, back pain and myalgias.  All other systems reviewed and are  negative.   Physical Exam Updated Vital Signs BP 101/76   Pulse 72   Temp 98 F (36.7 C) (Oral)   Resp 16   SpO2 97%   Physical Exam Vitals and nursing note reviewed.  Constitutional:      Appearance: She is well-developed.     Comments: Non toxic in NAD  HENT:     Head: Normocephalic and atraumatic.     Nose: Nose normal.  Eyes:     Conjunctiva/sclera: Conjunctivae normal.  Cardiovascular:     Rate and Rhythm: Normal rate and regular rhythm.  Pulmonary:     Effort: Pulmonary effort is normal.     Breath sounds: Normal breath sounds.  Abdominal:     General: Bowel sounds are normal.     Palpations: Abdomen is soft.     Tenderness: There is no abdominal tenderness.     Comments: No G/R/R. No suprapubic or CVA tenderness. Negative Murphy's and McBurney's. Active BS to lower quadrants.   Musculoskeletal:        General: Normal range of motion.     Cervical back: Normal range of motion.     Comments: No reproducible bony or muscular tenderness of right shoulder, clavicle, scapula, TL spine.  Normal skin over upper back/shoulder, no rash  Skin:    General: Skin is warm and dry.     Capillary Refill: Capillary refill takes less than 2 seconds.     Comments: Skin is normal in the APL abdomen/flank. No rash.   Neurological:     Mental Status: She is alert.  Psychiatric:        Behavior: Behavior normal.     ED Results / Procedures / Treatments   Labs (all labs ordered are listed, but only abnormal results are displayed) Labs Reviewed  COMPREHENSIVE METABOLIC PANEL - Abnormal; Notable for the following components:      Result Value   Glucose, Bld 109 (*)    All other components within normal limits  URINALYSIS, ROUTINE W REFLEX MICROSCOPIC - Abnormal; Notable for the following components:   APPearance CLOUDY (*)    Leukocytes,Ua SMALL (*)    All other components within normal limits  LIPASE, BLOOD  CBC  I-STAT BETA HCG BLOOD, ED ([facility], [facility], [facility] ONLY)     EKG None  Radiology No results found.  Procedures Procedures (including critical care time)  Medications Ordered in  ED Medications  sodium chloride flush (NS) 0.9 % injection 3 mL (has no administration in time range)    ED Course  I have reviewed the triage vital signs and the nursing notes.  Pertinent labs & imaging results that were available during my care of the patient were reviewed by me and considered in my medical decision making (see chart for details).  Clinical Course as of Jun 02 857  Fri Jun 03, 2019  [address]): SMALL [CG]  0647 RBC / HPF: 11-20 [CG]  0647 WBC, UA: 0-5 [CG]  0647 Bacteria, UA: NONE SEEN [CG]  0647 Squamous Epithelial / LPF: 0-5 [CG]  0647 Mucus: PRESENT [CG]    Clinical Course User Index [CG] Kinnie Feil, PA-C   MDM Rules/Calculators/A&P                      54 year old female with no significant PMH here for 4 days of pain in her right arm, back and lower abdomen.  Very mild per her description, fleeting in nature.  She has no pain currently and on my exam.  Patient developed symptoms the day after she picked up heavy furniture.  I am unable to reproduce any tenderness on exam here.  Her MSK, abdominal exam are completely benign, nontender.  She has no associated chest pain or other concerning symptoms like fever, vomiting, changes in her stools, urinary symptoms.  ER work-up initiated in triage, personally reviewed is reassuring.  Small leukocytes, 11-20 RBCs but no bacteria or WBCs in urine.  She has no urinary symptoms.  We will send urine for culture to rule out UTI but this is unlikely.  Explained this to patient.  Suspect some of her back, shoulder pain related to recent lifting/MSK etiology.  Her abdominal exam is benign and she has no other concerning symptoms.  Doubt life-threatening intra-abdominal or pelvic process.  S/p hysterectomy.  She has no vaginal symptoms either.  Recommended close monitoring of her  symptoms, NSAIDs.  Occasionally describes pain feels like "gas", will recommend simethicone.  Return precautions discussed with patient, she is aware symptoms to monitor for that would warrant return to the ER.  Encouraged PCP follow-up but patient does not have insurance or PCP.  She inquired about orange card.  I spoke to social worker [place] who met patient, she has a scheduled PCP appointment 2/15 where they will assist with applying for orange card.   Final Clinical Impression(s) / ED Diagnoses Final diagnoses:  Lower abdominal pain  Acute pain of right shoulder    Rx / DC Orders ED Discharge Orders    None         Kinnie Feil, PA-C 06/03/19 0859    Dorie Rank, MD 06/04/19 [phone]

## 2019-06-03 NOTE — ED Notes (Addendum)
Social work at bedside.  

## 2019-06-27 ENCOUNTER — Ambulatory Visit (INDEPENDENT_AMBULATORY_CARE_PROVIDER_SITE_OTHER): Payer: Self-pay | Admitting: Primary Care

## 2019-06-27 ENCOUNTER — Other Ambulatory Visit: Payer: Self-pay

## 2019-06-27 ENCOUNTER — Encounter (INDEPENDENT_AMBULATORY_CARE_PROVIDER_SITE_OTHER): Payer: Self-pay | Admitting: Primary Care

## 2019-06-27 VITALS — BP 127/82 | HR 70 | Temp 97.3°F | Ht 63.0 in | Wt 160.6 lb

## 2019-06-27 DIAGNOSIS — Z7689 Persons encountering health services in other specified circumstances: Secondary | ICD-10-CM

## 2019-06-27 DIAGNOSIS — R7303 Prediabetes: Secondary | ICD-10-CM

## 2019-06-27 DIAGNOSIS — R0981 Nasal congestion: Secondary | ICD-10-CM

## 2019-06-27 DIAGNOSIS — E119 Type 2 diabetes mellitus without complications: Secondary | ICD-10-CM

## 2019-06-27 LAB — POCT GLYCOSYLATED HEMOGLOBIN (HGB A1C): Hemoglobin A1C: 6.1 % — AB (ref 4.0–5.6)

## 2019-06-27 MED ORDER — FLUTICASONE PROPIONATE 50 MCG/ACT NA SUSP: 2.0000 | Freq: Every day | NASAL | 3 refills | Status: DC

## 2019-06-27 NOTE — Progress Notes (Signed)
LOGO@  Subjective:  Patient ID: Rachel Fletcher, female    DOB: 1966-03-10  Age: 54 y.o. MRN: [id number]  CC: No chief complaint on file.   HPI Rachel Fletcher presents for establishment of care. Her chief complaint is right arm is hurting approximately 2 weeks. She works as a Research scientist (physical sciences).   History reviewed. No pertinent past medical history.  Past Surgical History:  Procedure Laterality Date  . resection of submucous prolapsed myoma    . VAGINAL HYSTERECTOMY     TVH    Family History  Problem Relation Age of Onset  . Diabetes Sister   . Healthy Mother   . Healthy Father     Social History   Tobacco Use  . Smoking status: Former Smoker    Quit date: 05/12/2010    Years since quitting: 9.1  . Smokeless tobacco: Never Used  Substance Use Topics  . Alcohol use: No    ROS Review of Systems  Respiratory: Positive for shortness of breath.        Feels nose is stopped up  Musculoskeletal:       Right arm pain  Neurological: Positive for headaches.  All other systems reviewed and are negative.   Objective:   Today's Vitals: BP 127/82 (BP Location: Right Arm, Patient Position: Sitting, Cuff Size: Normal)   Pulse 70   Temp (!) 97.3 F (36.3 C) (Temporal)   Ht 5\' 3"  (1.6 m)   Wt 160 lb 9.6 oz (72.8 kg)   SpO2 99%   BMI 28.45 kg/m   Physical Exam Vitals reviewed.  Constitutional:      Appearance: Normal appearance.  HENT:     Head: Normocephalic.     Right Ear: There is impacted cerumen.     Left Ear: Tympanic membrane normal.  Eyes:     Extraocular Movements: Extraocular movements intact.     Pupils: Pupils are equal, round, and reactive to light.  Cardiovascular:     Rate and Rhythm: Normal rate and regular rhythm.  Pulmonary:     Effort: Pulmonary effort is normal.     Breath sounds: Normal breath sounds.  Abdominal:     General: Abdomen is flat. Bowel sounds are normal.   Musculoskeletal:        General: Normal range of motion.     Cervical back: Normal range of motion and neck supple.  Skin:    General: Skin is warm and dry.  Neurological:     Mental Status: She is alert and oriented to person, place, and time.  Psychiatric:        Mood and Affect: Mood normal.        Behavior: Behavior normal.        Thought Content: Thought content normal.        Judgment: Judgment normal.     Assessment & Plan:  Diagnoses and all orders for this visit:  Encounter to establish care  Type 2 diabetes mellitus without complication, without long-term current use of insulin (HCC) Prediabetes results from A1C indicates prediabetes which can be managed with diet discussed monitoring sugars, torte dos, bread,   -     HgB A1c 61   Nasal congestion Cleaning chemical inhaled and mask may cause the feeling of nasal congestion  FLONASE) 50 MCG/ACT nasal spray; Place 2 sprays into both nostrils daily.  Prediabetes A1C 6.1 discussed treatment no medications needed at this time.  Other orders -  fluticasone (FLONASE) 50 MCG/ACT nasal spray; Place 2 sprays into both nostrils daily.    Outpatient Encounter Medications as of 06/27/2019  Medication Sig  . b complex vitamins capsule Take 1 capsule by mouth daily.  Marland Kitchen CALCIUM PO Take by mouth.  . fluticasone (FLONASE) 50 MCG/ACT nasal spray Place 2 sprays into both nostrils daily.  . [DISCONTINUED] meclizine (ANTIVERT) 12.5 MG tablet Take 1-2 tablets (12.5-25 mg total) by mouth 3 (three) times daily as needed for dizziness.  . [DISCONTINUED] predniSONE (STERAPRED UNI-PAK 21 TAB) 10 MG (21) TBPK tablet Take by mouth daily. Take 6 tabs by mouth daily  for 1 day, then 5 tabs for 1 day, then 4 tabs for 1 day, then 3 tabs for 1 day, 2 tabs for 1 day, then 1 tab by mouth daily for 1 day  . [DISCONTINUED] psyllium (METAMUCIL) 58.6 % packet Take 1 packet by mouth daily.   No facility-administered encounter medications on file as  of 06/27/2019.    Follow-up: Return for health maintenace , labs , right ear irrigation.    Kerin Perna NP

## 2019-06-27 NOTE — Progress Notes (Signed)
Pt complains of right arm pain 

## 2019-06-27 NOTE — Patient Instructions (Signed)
Informacin bsica sobre la diabetes Diabetes Basics  La diabetes (diabetes mellitus) es una enfermedad de larga duracin (crnica). Se produce cuando el cuerpo no utiliza correctamente el azcar (glucosa) que se libera de los alimentos despus de comer. La diabetes puede deberse a uno de estos problemas o a ambos:  El pncreas no produce suficiente cantidad de una hormona llamada insulina.  El cuerpo no reacciona de forma normal a la insulina que produce. La insulina permite que ciertos azcares (glucosa) ingresen a las clulas del cuerpo. Esto le proporciona energa. Si tiene diabetes, los azcares no pueden ingresar a las clulas. Esto produce un aumento del nivel de azcar en la sangre (hiperglucemia). Sigue estas instrucciones en tu casa: Cmo se trata la diabetes? Es posible que tenga que administrarse insulina u otros medicamentos para la diabetes todos los das para mantener el nivel de azcar en la sangre equilibrado. Adminstrese los medicamentos para la diabetes todos los das como se lo haya indicado el mdico. Haga una lista de los medicamentos para la diabetes aqu: Medicamentos para la diabetes  Nombre del medicamento: ______________________________ ? Cantidad (dosis): ________________ Hora (a.m./p.m.): _______________ Notas: ___________________________________  Nombre del medicamento: ______________________________ ? Cantidad (dosis): ________________ Hora (a.m./p.m.): _______________ Notas: ___________________________________  Nombre del medicamento: ______________________________ ? Cantidad (dosis): ________________ Hora (a.m./p.m.): _______________ Notas: ___________________________________ Si usa insulina, aprender cmo aplicrsela con inyecciones. Es posible que deba ajustar la cantidad en funcin de los alimentos que coma. Haga una lista de los tipos de insulina que usa aqu: Insulina  Tipo de insulina: ______________________________ ? Cantidad (dosis):  ________________ Hora (a.m./p.m.): _______________ Notas: ___________________________________  Tipo de insulina: ______________________________ ? Cantidad (dosis): ________________ Hora (a.m./p.m.): _______________ Notas: ___________________________________  Tipo de insulina: ______________________________ ? Cantidad (dosis): ________________ Hora (a.m./p.m.): _______________ Notas: ___________________________________  Tipo de insulina: ______________________________ ? Cantidad (dosis): ________________ Hora (a.m./p.m.): _______________ Notas: ___________________________________  Tipo de insulina: ______________________________ ? Cantidad (dosis): ________________ Hora (a.m./p.m.): _______________ Notas: ___________________________________ Cmo me controlo el nivel de azcar en la sangre?  Controle sus niveles de azcar en la sangre con un medidor de glucemia segn las indicaciones del mdico. El mdico fijar los objetivos del tratamiento para usted. Generalmente, los resultados de los niveles de azcar en la sangre deben ser los siguientes:  Antes de las comidas (preprandial): de 80 a 130mg/dl (de 4,4 a 7,2mmol/l).  Despus de las comidas (posprandial): por debajo de 180mg/dl (10mmol/l).  Nivel de A1c: menos del 7%. Anote las veces que se controlar los niveles de azcar en la sangre: Controles de azcar en la sangre  Hora: _______________ Notas: ___________________________________  Hora: _______________ Notas: ___________________________________  Hora: _______________ Notas: ___________________________________  Hora: _______________ Notas: ___________________________________  Hora: _______________ Notas: ___________________________________  Hora: _______________ Notas: ___________________________________  Qu debo saber acerca del nivel bajo de azcar en la sangre? Un nivel bajo de azcar en la sangre se denomina hipoglucemia. Este cuadro ocurre cuando el  nivel de azcar en la sangre es igual o menor que 70mg/dl (3,9mmol/l). Entre los sntomas, se pueden incluir los siguientes:  Sentir: ? Hambre. ? Preocupacin o nervios (ansiedad). ? Sudoracin y piel hmeda. ? Confusin. ? Mareos. ? Somnolencia. ? Ganas de vomitar (nuseas).  Tener: ? Latidos cardacos acelerados. ? Dolor de cabeza. ? Cambios en la visin. ? Hormigueo y falta de sensibilidad (entumecimiento) alrededor de la boca, los labios o la lengua. ? Movimientos espasmdicos que no puede controlar (convulsiones).  Dificultades para hacer lo siguiente: ? Moverse (coordinacin). ? Dormir. ? Desmayos. ? Molestarse con facilidad (irritabilidad). Tratamiento del nivel   bajo de azcar en la sangre Para tratar un nivel bajo de azcar en la sangre, ingiera un alimento o una bebida azucarada de inmediato. Si puede pensar con claridad y tragar de manera segura, siga la regla 15/15, que consiste en lo siguiente:  Consuma 15gramos de un hidrato de carbono de accin rpida (carbohidrato). Hable con su mdico acerca de cunto debera consumir.  Algunos hidratos de carbono de accin rpida son: ? Comprimidos de azcar (pastillas de glucosa). Consuma 3o 4pastillas de glucosa. ? De 6 a 8unidades de caramelos duros. ? De 4 a 6onzas (de 120 a 150ml) de jugo de frutas. ? De 4 a 6onzas (de 120 a 150ml) de refresco comn (no diettico). ? 1 cucharada (15ml) de miel o azcar.  Contrlese el nivel de azcar en la sangre 15minutos despus de ingerir el hidrato de carbono.  Si el nivel de azcar en la sangre todava es igual o menor que 70mg/dl (3,9mmol/l), ingiera nuevamente 15gramos de un hidrato de carbono.  Si el nivel de azcar en la sangre no supera los 70mg/dl (3,9mmol/l) despus de 3intentos, solicite ayuda de inmediato.  Ingiera una comida o una colacin en el transcurso de 1hora despus de que el nivel de azcar en la sangre se haya normalizado. Tratamiento del nivel  muy bajo de azcar en la sangre Si el nivel de azcar en la sangre es igual o menor que 54mg/dl (3mmol/l), significa que est muy bajo (hipoglucemia grave). Esto es una emergencia. No espere a ver si los sntomas desaparecen. Solicite atencin mdica de inmediato. Comunquese con el servicio de emergencias de su localidad (911 en los [place]). No conduzca por sus propios medios hasta el hospital. Preguntas para hacerle al mdico  Es necesario que me rena con un instructor en el cuidado de la diabetes?  Qu equipos necesitar para cuidarme en casa?  Qu medicamentos para la diabetes necesito? Cundo debo tomarlos?  Con qu frecuencia debo controlar mi nivel de azcar en la sangre?  A qu nmero puedo llamar si tengo preguntas?  Cundo es mi prxima cita con el mdico?  Dnde puedo encontrar un grupo de apoyo para las personas con diabetes? Dnde buscar ms informacin  American Diabetes Association (Asociacin [place] de la Diabetes): www.diabetes.org  American Association of Diabetes Educators (Asociacin Estadounidense de Instructores para el Cuidado de la Diabetes): www.diabeteseducator.org/patient-resources Comunquese con un mdico si:  El nivel de azcar en la sangre es igual o mayor que 240mg/dl (13,3mmol/dl) durante 2das seguidos.  Ha estado enfermo o ha tenido fiebre durante 2das o ms y no mejora.  Tiene alguno de estos problemas durante ms de 6horas: ? No puede comer ni beber. ? Siente malestar estomacal (nuseas). ? Vomita. ? Presenta heces lquidas (diarrea). Solicite ayuda inmediatamente si:  El nivel de azcar en la sangre est por debajo de 54mg/dl (3mmol/l).  Se siente confundida.  Tiene dificultad para hacer lo siguiente: ? Pensar con claridad. ? La respiracin. Resumen  La diabetes (diabetes mellitus) es una enfermedad de larga duracin (crnica). Se produce cuando el cuerpo no utiliza correctamente el azcar (glucosa)  que se libera de los alimentos despus de la digestin.  Aplquese la insulina y tome los medicamentos para la diabetes como se lo hayan indicado.  Contrlese el nivel de azcar en la sangre todos los das, con la frecuencia que le hayan indicado.  Concurra a todas las visitas de seguimiento como se lo haya indicado el mdico. Esto es importante. Esta informacin no tiene como fin reemplazar   el consejo del mdico. Asegrese de hacerle al mdico cualquier pregunta que tenga. Document Revised: 06/23/2018 Document Reviewed: 09/04/2017 Elsevier Patient Education  2020 [employer]  

## 2019-07-20 ENCOUNTER — Other Ambulatory Visit: Payer: Self-pay

## 2019-07-20 ENCOUNTER — Ambulatory Visit (INDEPENDENT_AMBULATORY_CARE_PROVIDER_SITE_OTHER): Payer: Self-pay | Admitting: Primary Care

## 2019-07-20 ENCOUNTER — Encounter (INDEPENDENT_AMBULATORY_CARE_PROVIDER_SITE_OTHER): Payer: Self-pay | Admitting: Primary Care

## 2019-07-20 VITALS — BP 118/73 | HR 76 | Temp 97.2°F | Ht 63.0 in | Wt 160.8 lb

## 2019-07-20 DIAGNOSIS — Z1211 Encounter for screening for malignant neoplasm of colon: Secondary | ICD-10-CM

## 2019-07-20 DIAGNOSIS — Z23 Encounter for immunization: Secondary | ICD-10-CM

## 2019-07-20 DIAGNOSIS — E119 Type 2 diabetes mellitus without complications: Secondary | ICD-10-CM

## 2019-07-20 DIAGNOSIS — Z1231 Encounter for screening mammogram for malignant neoplasm of breast: Secondary | ICD-10-CM

## 2019-07-20 DIAGNOSIS — Z114 Encounter for screening for human immunodeficiency virus [HIV]: Secondary | ICD-10-CM

## 2019-07-20 LAB — GLUCOSE, POCT (MANUAL RESULT ENTRY): POC Glucose: 100 mg/dl — AB (ref 70–99)

## 2019-07-20 NOTE — Progress Notes (Signed)
Established Patient Office Visit  Subjective:  Patient ID: Rachel Fletcher, female    DOB: 1966/02/23  Age: 54 y.o. MRN: [id number]  CC:  Chief Complaint  Patient presents with  . Fatigue  . Pruritis    whole body- not everyday    HPI Rachel Fletcher presents for   No past medical history on file.  Past Surgical History:  Procedure Laterality Date  . resection of submucous prolapsed myoma    . VAGINAL HYSTERECTOMY     TVH    Family History  Problem Relation Age of Onset  . Diabetes Sister   . Healthy Mother   . Healthy Father     Social History   Socioeconomic History  . Marital status: Married    Spouse name: Not on file  . Number of children: Not on file  . Years of education: Not on file  . Highest education level: Not on file  Occupational History  . Not on file  Tobacco Use  . Smoking status: Former Smoker    Quit date: 05/12/2010    Years since quitting: 9.1  . Smokeless tobacco: Never Used  Substance and Sexual Activity  . Alcohol use: No  . Drug use: No  . Sexual activity: Yes    Birth control/protection: Surgical    Comment: TVH  Other Topics Concern  . Not on file  Social History Narrative  . Not on file   Social Determinants of Health   Financial Resource Strain:   . Difficulty of Paying Living Expenses: Not on file  Food Insecurity:   . Worried About Programme researcher, broadcasting/film/video in the Last Year: Not on file  . Ran Out of Food in the Last Year: Not on file  Transportation Needs:   . Lack of Transportation (Medical): Not on file  . Lack of Transportation (Non-Medical): Not on file  Physical Activity:   . Days of Exercise per Week: Not on file  . Minutes of Exercise per Session: Not on file  Stress:   . Feeling of Stress : Not on file  Social Connections:   . Frequency of Communication with Friends and Family: Not on file  . Frequency of Social Gatherings with Friends and Family: Not on file  . Attends Religious  Services: Not on file  . Active Member of Clubs or Organizations: Not on file  . Attends Banker Meetings: Not on file  . Marital Status: Not on file  Intimate Partner Violence:   . Fear of Current or Ex-Partner: Not on file  . Emotionally Abused: Not on file  . Physically Abused: Not on file  . Sexually Abused: Not on file    Outpatient Medications Prior to Visit  Medication Sig Dispense Refill  . fluticasone (FLONASE) 50 MCG/ACT nasal spray Place 2 sprays into both nostrils daily. 16 g 3  . b complex vitamins capsule Take 1 capsule by mouth daily.    Marland Kitchen CALCIUM PO Take by mouth.     No facility-administered medications prior to visit.    No Known Allergies  ROS Review of Systems  All other systems reviewed and are negative.     Objective:    Physical Exam  Constitutional: She is oriented to person, place, and time. She appears well-developed and well-nourished.  HENT:  Head: Normocephalic.  Cerumen in right ear  Eyes: Pupils are equal, round, and reactive to light. EOM are normal.  Cardiovascular: Normal rate and regular rhythm.  Pulmonary/Chest: Effort  normal and breath sounds normal.  Abdominal: Soft. Bowel sounds are normal.  Musculoskeletal:        General: Normal range of motion.     Cervical back: Normal range of motion and neck supple.  Neurological: She is oriented to person, place, and time. She has normal reflexes.  Skin: Skin is warm and dry.  Psychiatric: She has a normal mood and affect. Her behavior is normal. Judgment and thought content normal.    BP 118/73 (BP Location: Right Arm, Patient Position: Sitting, Cuff Size: Large)   Pulse 76   Temp (!) 97.2 F (36.2 C) (Temporal)   Ht 5\' 3"  (1.6 m)   Wt 160 lb 12.8 oz (72.9 kg)   SpO2 96%   BMI 28.48 kg/m  Wt Readings from Last 3 Encounters:  07/20/19 160 lb 12.8 oz (72.9 kg)  06/27/19 160 lb 9.6 oz (72.8 kg)  07/03/15 162 lb (73.5 kg)     Health Maintenance Due  Topic Date Due   . OPHTHALMOLOGY EXAM  03/27/1977  . URINE MICROALBUMIN  03/27/1977  . HIV Screening  03/27/1982  . PAP SMEAR-Modifier  11/10/2007  . COLONOSCOPY  03/27/2017  . MAMMOGRAM  06/06/2017    There are no preventive care reminders to display for this patient.  Lab Results  Component Value Date   TSH 2.201 01/26/2012   Lab Results  Component Value Date   WBC 4.7 06/03/2019   HGB 13.6 06/03/2019   HCT 41.2 06/03/2019   MCV 92.2 06/03/2019   PLT 270 06/03/2019   Lab Results  Component Value Date   NA 137 06/03/2019   K 3.9 06/03/2019   CO2 24 06/03/2019   GLUCOSE 109 (H) 06/03/2019   BUN 18 06/03/2019   CREATININE 0.80 06/03/2019   BILITOT 0.3 06/03/2019   ALKPHOS 57 06/03/2019   AST 25 06/03/2019   ALT 23 06/03/2019   PROT 7.5 06/03/2019   ALBUMIN 4.3 06/03/2019   CALCIUM 9.6 06/03/2019   ANIONGAP 12 06/03/2019   Lab Results  Component Value Date   CHOL 295 (H) 06/22/2015   Lab Results  Component Value Date   HDL 69 06/22/2015   Lab Results  Component Value Date   LDLCALC 199 (H) 06/22/2015   Lab Results  Component Value Date   TRIG 135 06/22/2015   Lab Results  Component Value Date   CHOLHDL 4.3 06/22/2015   Lab Results  Component Value Date   HGBA1C 6.1 (A) 06/27/2019      Assessment & Plan:  Rachel Fletcher was seen today for fatigue and pruritis.  Diagnoses and all orders for this visit:  Special screening for malignant neoplasms, colon -     Fecal occult blood, imunochemical([facility]); Future  Type 2 diabetes mellitus without complication, without long-term current use of insulin (HCC)  Goal of therapy: Less than 6.5 hemoglobin A1c. She is at goal 6.1 A1C . Continue to decrease foods that are high in carbohydrates are the following rice, potatoes, breads, sugars, and pastas.  Reduction in the intake (eating) will assist in lowering your blood sugars. -     Microalbumin/Creatinine Ratio, Urine -     Glucose (CBG)  -     Ambulatory referral to  Ophthalmology  Comprehensive diabetic foot examination, type 2 DM, encounter for [facility]) Sensory exam of the foot is normal, tested with the monofilament. Good pulses, no lesions or ulcers, good peripheral pulses.  Breast cancer screening by mammogram Patient completed application for BCCP while in clinic and  application has and faxed to [facility]. Patient aware that [facility] will contact her directly to schedule appointment.  Screening for HIV (human immunodeficiency virus) -     HIV Antibody (routine testing w rflx)  Need for prophylactic vaccination against Streptococcus pneumoniae (pneumococcus) -     Pneumococcal polysaccharide vaccine 23-valent greater than or equal to 2yo subcutaneous/IM  Other orders -        No orders of the defined types were placed in this encounter.   Follow-up: Return in about 5 months (around 12/20/2019) for DM management .    Kerin Perna, NP

## 2019-07-20 NOTE — Patient Instructions (Signed)
Mantenimiento de la salud en las mujeres Health Maintenance, Female Adoptar un estilo de vida saludable y recibir atencin preventiva son importantes para promover la salud y el bienestar. Consulte al mdico sobre:  El esquema adecuado para hacerse pruebas y exmenes peridicos.  Cosas que puede hacer por su cuenta para prevenir enfermedades y mantenerse sana. Qu debo saber sobre la dieta, el peso y el ejercicio? Consuma una dieta saludable   Consuma una dieta que incluya muchas verduras, frutas, productos lcteos con bajo contenido de grasa y protenas magras.  No consuma muchos alimentos ricos en grasas slidas, azcares agregados o sodio. Mantenga un peso saludable El ndice de masa muscular (IMC) se utiliza para identificar problemas de peso. Proporciona una estimacin de la grasa corporal basndose en el peso y la altura. Su mdico puede ayudarle a determinar su IMC y a lograr o mantener un peso saludable. Haga ejercicio con regularidad Haga ejercicio con regularidad. Esta es una de las prcticas ms importantes que puede hacer por su salud. La mayora de los adultos deben seguir estas pautas:  Realizar, al menos, 150minutos de actividad fsica por semana. El ejercicio debe aumentar la frecuencia cardaca y hacerlo transpirar (ejercicio de intensidad moderada).  Hacer ejercicios de fortalecimiento por lo menos dos veces por semana. Agregue esto a su plan de ejercicio de intensidad moderada.  Pasar menos tiempo sentados. Incluso la actividad fsica ligera puede ser beneficiosa. Controle sus niveles de colesterol y lpidos en la sangre Comience a realizarse anlisis de lpidos y colesterol en la sangre a los 20aos y luego reptalos cada 5aos. Hgase controlar los niveles de colesterol con mayor frecuencia si:  Sus niveles de lpidos y colesterol son altos.  Es mayor de 40aos.  Presenta un alto riesgo de padecer enfermedades cardacas. Qu debo saber sobre las pruebas de  deteccin del cncer? Segn su historia clnica y sus antecedentes familiares, es posible que deba realizarse pruebas de deteccin del cncer en diferentes edades. Esto puede incluir pruebas de deteccin de lo siguiente:  Cncer de mama.  Cncer de cuello uterino.  Cncer colorrectal.  Cncer de piel.  Cncer de pulmn. Qu debo saber sobre la enfermedad cardaca, la diabetes y la hipertensin arterial? Presin arterial y enfermedad cardaca  La hipertensin arterial causa enfermedades cardacas y aumenta el riesgo de accidente cerebrovascular. Es ms probable que esto se manifieste en las personas que tienen lecturas de presin arterial alta, tienen ascendencia africana o tienen sobrepeso.  Hgase controlar la presin arterial: ? Cada 3 a 5 aos si tiene entre 18 y 39 aos. ? Todos los aos si es mayor de 40aos. Diabetes Realcese exmenes de deteccin de la diabetes con regularidad. Este anlisis revisa el nivel de azcar en la sangre en ayunas. Hgase las pruebas de deteccin:  Cada tresaos despus de los 40aos de edad si tiene un peso normal y un bajo riesgo de padecer diabetes.  Con ms frecuencia y a partir de una edad inferior si tiene sobrepeso o un alto riesgo de padecer diabetes. Qu debo saber sobre la prevencin de infecciones? Hepatitis B Si tiene un riesgo ms alto de contraer hepatitis B, debe someterse a un examen de deteccin de este virus. Hable con el mdico para averiguar si tiene riesgo de contraer la infeccin por hepatitis B. Hepatitis C Se recomienda el anlisis a:  Todos los que nacieron entre 1945 y 1965.  Todas las personas que tengan un riesgo de haber contrado hepatitis C. Enfermedades de transmisin sexual (ETS)  Hgase las   pruebas de deteccin de ITS, incluidas la gonorrea y la clamidia, si: ? Es sexualmente activa y es menor de 24aos. ? Es mayor de 24aos, y el mdico le informa que corre riesgo de tener este tipo de  infecciones. ? La actividad sexual ha cambiado desde que le hicieron la ltima prueba de deteccin y tiene un riesgo mayor de tener clamidia o gonorrea. Pregntele al mdico si usted tiene riesgo.  Pregntele al mdico si usted tiene un alto riesgo de contraer VIH. El mdico tambin puede recomendarle un medicamento recetado para ayudar a evitar la infeccin por el VIH. Si elige tomar medicamentos para prevenir el VIH, primero debe hacerse los anlisis de deteccin del VIH. Luego debe hacerse anlisis cada 3meses mientras est tomando los medicamentos. Embarazo  Si est por dejar de menstruar (fase premenopusica) y usted puede quedar embarazada, busque asesoramiento antes de quedar embarazada.  Tome de 400 a 800microgramos (mcg) de cido flico todos los das si queda embarazada.  Pida mtodos de control de la natalidad (anticonceptivos) si desea evitar un embarazo no deseado. Osteoporosis y menopausia La osteoporosis es una enfermedad en la que los huesos pierden los minerales y la fuerza por el avance de la edad. El resultado pueden ser fracturas en los huesos. Si tiene 65aos o ms, o si est en riesgo de sufrir osteoporosis y fracturas, pregunte a su mdico si debe:  Hacerse pruebas de deteccin de prdida sea.  Tomar un suplemento de calcio o de vitamina D para reducir el riesgo de fracturas.  Recibir terapia de reemplazo hormonal (TRH) para tratar los sntomas de la menopausia. Siga estas instrucciones en su casa: Estilo de vida  No consuma ningn producto que contenga nicotina o tabaco, como cigarrillos, cigarrillos electrnicos y tabaco de mascar. Si necesita ayuda para dejar de fumar, consulte al mdico.  No consuma drogas.  No comparta agujas.  Solicite ayuda a su mdico si necesita apoyo o informacin para abandonar las drogas. Consumo de alcohol  No beba alcohol si: ? Su mdico le indica no hacerlo. ? Est embarazada, puede estar embarazada o est tratando de quedar  embarazada.  Si bebe alcohol: ? Limite la cantidad que consume de 0 a 1 medida por da. ? Limite la ingesta si est amamantando.  Est atento a la cantidad de alcohol que hay en las bebidas que toma. En [place], una medida equivale a una botella de cerveza de 12oz (355ml), un vaso de vino de 5oz (148ml) o un vaso de una bebida alcohlica de alta graduacin de 1oz (44ml). Instrucciones generales  Realcese los estudios de rutina de la salud, dentales y de la vista.  Mantngase al da con las vacunas.  Infrmele a su mdico si: ? Se siente deprimida con frecuencia. ? Alguna vez ha sido vctima de maltrato o no se siente segura en su casa. Resumen  Adoptar un estilo de vida saludable y recibir atencin preventiva son importantes para promover la salud y el bienestar.  Siga las instrucciones del mdico acerca de una dieta saludable, el ejercicio y la realizacin de pruebas o exmenes para detectar enfermedades.  Siga las instrucciones del mdico con respecto al control del colesterol y la presin arterial. Esta informacin no tiene como fin reemplazar el consejo del mdico. Asegrese de hacerle al mdico cualquier pregunta que tenga. Document Revised: 05/19/2018 Document Reviewed: 05/19/2018 Elsevier Patient Education  2020 Elsevier Inc.  

## 2019-07-21 LAB — HIV ANTIBODY (ROUTINE TESTING W REFLEX): HIV Screen 4th Generation wRfx: NONREACTIVE

## 2019-07-21 LAB — MICROALBUMIN / CREATININE URINE RATIO
Creatinine, Urine: 85.9 mg/dL
Microalb/Creat Ratio: <3 mg/g creat (ref 0–29)
Microalbumin, Urine: <3 ug/mL

## 2019-08-04 ENCOUNTER — Other Ambulatory Visit (INDEPENDENT_AMBULATORY_CARE_PROVIDER_SITE_OTHER): Payer: Self-pay

## 2019-08-04 DIAGNOSIS — Z1211 Encounter for screening for malignant neoplasm of colon: Secondary | ICD-10-CM

## 2019-08-05 ENCOUNTER — Ambulatory Visit: Payer: Self-pay | Attending: Family Medicine

## 2019-08-05 ENCOUNTER — Other Ambulatory Visit: Payer: Self-pay

## 2019-08-05 LAB — FECAL OCCULT BLOOD, IMMUNOCHEMICAL: Fecal Occult Bld: NEGATIVE

## 2019-08-10 ENCOUNTER — Telehealth (INDEPENDENT_AMBULATORY_CARE_PROVIDER_SITE_OTHER): Payer: Self-pay

## 2019-08-10 NOTE — Telephone Encounter (Signed)
-----   Message from Grayce Sessions, NP sent at 08/09/2019 10:50 AM EDT ----- FOBT negative repeat 1 year

## 2019-08-10 NOTE — Telephone Encounter (Signed)
Call placed to patient using pacific interpreter [phone]) patient verified self with last name and date of birth. She is aware that FIT was negative, will repeat in one year. Maryjean Morn, CMA

## 2019-12-26 ENCOUNTER — Ambulatory Visit (INDEPENDENT_AMBULATORY_CARE_PROVIDER_SITE_OTHER): Payer: Self-pay | Admitting: Primary Care

## 2023-10-14 ENCOUNTER — Telehealth (INDEPENDENT_AMBULATORY_CARE_PROVIDER_SITE_OTHER): Payer: Self-pay | Admitting: Primary Care

## 2023-10-14 NOTE — Telephone Encounter (Signed)
 Left VM with pt about their upcoming appt.

## 2023-10-15 ENCOUNTER — Encounter (INDEPENDENT_AMBULATORY_CARE_PROVIDER_SITE_OTHER): Payer: Self-pay | Admitting: Primary Care

## 2023-10-15 ENCOUNTER — Ambulatory Visit (INDEPENDENT_AMBULATORY_CARE_PROVIDER_SITE_OTHER): Admitting: Primary Care

## 2023-10-15 VITALS — BP 137/85 | HR 76 | Resp 16 | Ht 67.0 in | Wt 159.2 lb

## 2023-10-15 DIAGNOSIS — E119 Type 2 diabetes mellitus without complications: Secondary | ICD-10-CM

## 2023-10-15 DIAGNOSIS — L819 Disorder of pigmentation, unspecified: Secondary | ICD-10-CM

## 2023-10-15 DIAGNOSIS — Z1159 Encounter for screening for other viral diseases: Secondary | ICD-10-CM | POA: Diagnosis not present

## 2023-10-15 DIAGNOSIS — Z23 Encounter for immunization: Secondary | ICD-10-CM

## 2023-10-15 DIAGNOSIS — Z1231 Encounter for screening mammogram for malignant neoplasm of breast: Secondary | ICD-10-CM

## 2023-10-15 DIAGNOSIS — Z1211 Encounter for screening for malignant neoplasm of colon: Secondary | ICD-10-CM

## 2023-10-15 NOTE — Progress Notes (Signed)
 New Patient Office Visit  Subjective    Patient ID: Rachel Fletcher female  DOB: 1965-07-12  Age: 58 y.o. MRN: [id number]   CC:  Spots on her face (rash)  HPI     New Patient (Initial Visit)    Additional comments: Re-establish       Last edited by Kathryne Parisian, RMA on 10/15/2023 10:45 AM.      HPI  Rachel Fletcher is a 58 year old Hispanic female(interpreter present [place]) and daughter is present.  Patient has given daughter permission to be present at her appointment.  She is concerned about discoloration and spots on her face not painful not irritating no drainage or signs and symptoms of infection.  She has a history of prediabetes  No current outpatient medications on file prior to visit.   No current facility-administered medications on file prior to visit.     No Known Allergies  No past medical history on file.   Past Surgical History:  Procedure Laterality Date   resection of submucous prolapsed myoma     VAGINAL HYSTERECTOMY     TVH     Family History  Problem Relation Age of Onset   Diabetes Sister    Healthy Mother    Healthy Father     Social History   Socioeconomic History   Marital status: Married    Spouse name: Not on file   Number of children: Not on file   Years of education: Not on file   Highest education level: Not on file  Occupational History   Not on file  Tobacco Use   Smoking status: Former    Current packs/day: 0.00    Types: Cigarettes    Quit date: 05/12/2010    Years since quitting: 13.4   Smokeless tobacco: Never  Vaping Use   Vaping status: Never Used  Substance and Sexual Activity   Alcohol use: No   Drug use: No   Sexual activity: Yes    Birth control/protection: Surgical    Comment: TVH  Other Topics Concern   Not on file  Social History Narrative   Not on file   Social Drivers of Health   Financial Resource Strain: Not on file  Food Insecurity: Not on file  Transportation  Needs: Not on file  Physical Activity: Not on file  Stress: Not on file  Social Connections: Not on file  Intimate Partner Violence: Not on file    SDOH Interventions Today    Flowsheet Row Most Recent Value  SDOH Interventions   Depression Interventions/Treatment  Counseling        Health Maintenance  Topic Date Due   Eye exam for diabetics  Never done   Hepatitis C Screening  Never done   Pap with HPV screening  11/09/2009   Colon Cancer Screening  Never done   Mammogram  06/06/2017   Hemoglobin A1C  12/25/2019   Yearly kidney function blood test for diabetes  06/02/2020   Yearly kidney health urinalysis for diabetes  07/19/2020   Complete foot exam   07/19/2020   COVID-19 Vaccine (1 - 2024-25 season) Never done   Zoster (Shingles) Vaccine (1 of 2) 01/15/2024*   Pneumococcal Vaccination (2 of 2 - PCV) 10/14/2024*   Flu Shot  12/11/2023   DTaP/Tdap/Td vaccine (3 - Td or Tdap) 10/14/2033   HIV Screening  Completed   HPV Vaccine  Aged Out   Meningitis B Vaccine  Aged Out  *Topic was postponed. The date  shown is not the original due date.    Objective    BP 137/85   Pulse 76   Resp 16   Ht 5\' 7"  (1.702 m)   Wt 159 lb 3.2 oz (72.2 kg)   SpO2 99%   BMI 24.93 kg/m  BP Readings from Last 3 Encounters:  10/15/23 137/85  07/20/19 118/73  06/27/19 127/82       Physical Exam Vitals reviewed.  Constitutional:      Appearance: Normal appearance.  HENT:     Head: Normocephalic.     Right Ear: Tympanic membrane, ear canal and external ear normal.     Left Ear: Tympanic membrane, ear canal and external ear normal.     Nose: Nose normal.     Mouth/Throat:     Mouth: Mucous membranes are moist.  Eyes:     Extraocular Movements: Extraocular movements intact.     Pupils: Pupils are equal, round, and reactive to light.  Cardiovascular:     Rate and Rhythm: Normal rate.  Pulmonary:     Effort: Pulmonary effort is normal.     Breath sounds: Normal breath sounds.   Abdominal:     General: Bowel sounds are normal.     Palpations: Abdomen is soft.  Musculoskeletal:        General: Normal range of motion.     Cervical back: Normal range of motion.  Skin:    General: Skin is warm and dry.  Neurological:     Mental Status: She is alert and oriented to person, place, and time.  Psychiatric:        Mood and Affect: Mood normal.        Behavior: Behavior normal.        Thought Content: Thought content normal.        Assessment & Plan:  Rachel Fletcher was seen today for new patient (initial visit).  Diagnoses and all orders for this visit:  Encounter for immunization -     Lipid panel -     Tdap vaccine greater than or equal to 7yo IM  Type 2 diabetes mellitus without complication, without long-term current use of insulin (HCC) -     Microalbumin / creatinine urine ratio -     Ambulatory referral to Ophthalmology -     CBC with Differential/Platelet -     CMP14+EGFR -     Hemoglobin A1c -     Lipid panel  Encounter for HCV screening test for low risk patient -     HCV Ab w Reflex to Quant PCR  Encounter for screening mammogram for malignant neoplasm of breast -     MM 3D SCREENING MAMMOGRAM BILATERAL BREAST; Future  Colon cancer screening -     Cologuard  Depigmentation of skin     Ambulatory referral to Dermatology   Follow-up:  6 weeks pap   The above assessment and management plan was discussed with the patient. The patient verbalized understanding of and has agreed to the management plan. Patient is aware to call the clinic if symptoms fail to improve or worsen. Patient is aware when to return to the clinic for a follow-up visit. Patient educated on when it is appropriate to go to the emergency department.   Madelyn Schick, NP-C

## 2023-10-15 NOTE — Addendum Note (Signed)
 Addended by: Kathryne Parisian on: 10/15/2023 02:09 PM   Modules accepted: Orders

## 2023-10-16 ENCOUNTER — Other Ambulatory Visit (INDEPENDENT_AMBULATORY_CARE_PROVIDER_SITE_OTHER)

## 2023-10-16 DIAGNOSIS — E119 Type 2 diabetes mellitus without complications: Secondary | ICD-10-CM

## 2023-10-16 DIAGNOSIS — Z1211 Encounter for screening for malignant neoplasm of colon: Secondary | ICD-10-CM

## 2023-10-16 DIAGNOSIS — Z1159 Encounter for screening for other viral diseases: Secondary | ICD-10-CM

## 2023-10-16 DIAGNOSIS — Z23 Encounter for immunization: Secondary | ICD-10-CM

## 2023-10-16 NOTE — Progress Notes (Signed)
 Pt came into the office for labs

## 2023-10-17 LAB — CBC WITH DIFFERENTIAL/PLATELET

## 2023-10-17 LAB — FECAL OCCULT BLOOD, IMMUNOCHEMICAL: Fecal Occult Bld: NEGATIVE

## 2023-10-18 ENCOUNTER — Ambulatory Visit (INDEPENDENT_AMBULATORY_CARE_PROVIDER_SITE_OTHER): Payer: Self-pay | Admitting: Primary Care

## 2023-10-18 DIAGNOSIS — E782 Mixed hyperlipidemia: Secondary | ICD-10-CM

## 2023-10-18 MED ORDER — ATORVASTATIN CALCIUM 20 MG PO TABS: 20.0000 mg | ORAL_TABLET | Freq: Every day | ORAL | 3 refills | Status: AC

## 2023-10-19 LAB — CMP14+EGFR
ALT: 21 IU/L (ref 0–32)
AST: 23 IU/L (ref 0–40)
Albumin: 4.5 g/dL (ref 3.8–4.9)
Alkaline Phosphatase: 75 IU/L (ref 44–121)
BUN/Creatinine Ratio: 15 (ref 9–23)
BUN: 9 mg/dL (ref 6–24)
Bilirubin Total: 0.3 mg/dL (ref 0.0–1.2)
CO2: 23 mmol/L (ref 20–29)
Calcium: 9.7 mg/dL (ref 8.7–10.2)
Chloride: 101 mmol/L (ref 96–106)
Creatinine, Ser: 0.61 mg/dL (ref 0.57–1.00)
Globulin, Total: 2.8 g/dL (ref 1.5–4.5)
Glucose: 91 mg/dL (ref 70–99)
Potassium: 4.4 mmol/L (ref 3.5–5.2)
Sodium: 139 mmol/L (ref 134–144)
Total Protein: 7.3 g/dL (ref 6.0–8.5)
eGFR: 105 mL/min/{1.73_m2} (ref >59)

## 2023-10-19 LAB — MICROALBUMIN / CREATININE URINE RATIO
Creatinine, Urine: 138.9 mg/dL
Microalb/Creat Ratio: 4 mg/g{creat} (ref 0–29)
Microalbumin, Urine: 6.2 ug/mL

## 2023-10-19 LAB — LIPID PANEL
Chol/HDL Ratio: 3.8 ratio (ref 0.0–4.4)
Cholesterol, Total: 234 mg/dL — ABNORMAL HIGH (ref 100–199)
HDL: 62 mg/dL (ref >39)
LDL Chol Calc (NIH): 161 mg/dL — ABNORMAL HIGH (ref 0–99)
Triglycerides: 67 mg/dL (ref 0–149)
VLDL Cholesterol Cal: 11 mg/dL (ref 5–40)

## 2023-10-19 LAB — CBC WITH DIFFERENTIAL/PLATELET
Basos: 1 %
EOS (ABSOLUTE): 0 10*3/uL (ref 0.0–0.2)
Eos: 2 %
Hematocrit: 36.9 % (ref 34.0–46.6)
Hemoglobin: 12.1 g/dL (ref 11.1–15.9)
Immature Granulocytes: 0 %
Immature Granulocytes: 0 10*3/uL (ref 0.0–0.1)
Lymphs: 32 %
MCH: 30.7 pg (ref 26.6–33.0)
MCHC: 32.8 g/dL (ref 31.5–35.7)
MCV: 94 fL (ref 79–97)
Monocytes Absolute: 0.1 10*3/uL (ref 0.0–0.4)
Monocytes Absolute: 0.3 10*3/uL (ref 0.1–0.9)
Monocytes: 6 %
Neutrophils Absolute: 1.3 10*3/uL (ref 0.7–3.1)
Neutrophils Absolute: 2.4 10*3/uL (ref 1.4–7.0)
Neutrophils: 59 %
Platelets: 111 10*3/uL — ABNORMAL LOW (ref 150–450)
RBC: 3.94 x10E6/uL (ref 3.77–5.28)
RDW: 13.2 % (ref 11.7–15.4)
WBC: 4 10*3/uL (ref 3.4–10.8)

## 2023-10-19 LAB — HEMOGLOBIN A1C
Est. average glucose Bld gHb Est-mCnc: 140 mg/dL
Hgb A1c MFr Bld: 6.5 % — ABNORMAL HIGH (ref 4.8–5.6)

## 2023-10-19 LAB — HCV AB W REFLEX TO QUANT PCR: HCV Ab: NONREACTIVE

## 2023-10-19 LAB — HCV INTERPRETATION

## 2023-10-22 ENCOUNTER — Inpatient Hospital Stay
Admission: RE | Admit: 2023-10-22 | Discharge: 2023-10-22 | Disposition: A | Discharge status: Home / Self Care | Source: Ambulatory Visit | Attending: Primary Care | Admitting: Primary Care

## 2023-10-22 DIAGNOSIS — Z1231 Encounter for screening mammogram for malignant neoplasm of breast: Secondary | ICD-10-CM

## 2023-10-23 ENCOUNTER — Encounter

## 2023-11-10 ENCOUNTER — Encounter: Payer: Self-pay | Admitting: Primary Care

## 2023-11-26 ENCOUNTER — Telehealth (INDEPENDENT_AMBULATORY_CARE_PROVIDER_SITE_OTHER): Payer: Self-pay | Admitting: Primary Care

## 2023-11-26 NOTE — Telephone Encounter (Signed)
 Called pt to confirm appt 7/18 LVM

## 2023-11-27 ENCOUNTER — Encounter (INDEPENDENT_AMBULATORY_CARE_PROVIDER_SITE_OTHER): Payer: Self-pay | Admitting: Primary Care

## 2023-11-27 ENCOUNTER — Other Ambulatory Visit (HOSPITAL_COMMUNITY)
Admission: RE | Admit: 2023-11-27 | Discharge: 2023-11-27 | Disposition: A | Discharge status: Home / Self Care | Source: Ambulatory Visit | Attending: Primary Care | Admitting: Primary Care

## 2023-11-27 ENCOUNTER — Ambulatory Visit (INDEPENDENT_AMBULATORY_CARE_PROVIDER_SITE_OTHER): Admitting: Primary Care

## 2023-11-27 VITALS — BP 129/84 | HR 74 | Resp 16 | Wt 158.2 lb

## 2023-11-27 DIAGNOSIS — Z0000 Encounter for general adult medical examination without abnormal findings: Secondary | ICD-10-CM | POA: Diagnosis not present

## 2023-11-27 DIAGNOSIS — Z124 Encounter for screening for malignant neoplasm of cervix: Secondary | ICD-10-CM | POA: Diagnosis present

## 2023-11-27 NOTE — Progress Notes (Signed)
 [facility]   Subjective:   Rachel Fletcher is a 58 y.o. [id number] female here for a routine annual gynecologic exam.  Current complaints: none.   Denies abnormal vaginal bleeding, discharge, pelvic pain, problems with intercourse or other gynecologic concerns.    Gynecologic History No LMP recorded. Patient has had a hysterectomy. Contraception: status post hysterectomy Last mammogram: 06/08/15. Health Maintenance  Topic Date Due   OPHTHALMOLOGY EXAM  Never done   Hepatitis B Vaccines (1 of 3 - 19+ 3-dose series) Never done   Cervical Cancer Screening (HPV/Pap Cotest)  11/09/2009   Colonoscopy  Never done   FOOT EXAM  07/19/2020   COVID-19 Vaccine (1 - 2024-25 season) Never done   Zoster Vaccines- Shingrix (1 of 2) 01/15/2024 (Originally 03/27/2017)   Pneumococcal Vaccine 66-39 Years old (2 of 2 - PCV) 10/14/2024 (Originally 07/19/2020)   INFLUENZA VACCINE  12/11/2023   HEMOGLOBIN A1C  04/16/2024   Diabetic kidney evaluation - eGFR measurement  10/15/2024   Diabetic kidney evaluation - Urine ACR  10/15/2024   MAMMOGRAM  10/21/2025   DTaP/Tdap/Td (3 - Td or Tdap) 10/14/2033   Hepatitis C Screening  Completed   HIV Screening  Completed   HPV VACCINES  Aged Out   Meningococcal B Vaccine  Aged Out    Obstetric History OB History  Gravida Para Term Preterm AB Living  5 2   3 2   SAB IAB Ectopic Multiple Live Births  3        # Outcome Date GA Lbr Len/2nd Weight Sex Type Anes PTL Lv  5 SAB           4 SAB           3 SAB           2 Para           1 Para             No past medical history on file.  Past Surgical History:  Procedure Laterality Date   resection of submucous prolapsed myoma     VAGINAL HYSTERECTOMY     TVH    Current Outpatient Medications on File Prior to Visit  Medication Sig Dispense Refill   atorvastatin  (LIPITOR) 20 MG tablet Take 1 tablet (20 mg total) by mouth daily. 90 tablet 3   No current facility-administered  medications on file prior to visit.    No Known Allergies  Social History   Socioeconomic History   Marital status: Married    Spouse name: Not on file   Number of children: Not on file   Years of education: Not on file   Highest education level: Not on file  Occupational History   Not on file  Tobacco Use   Smoking status: Former    Current packs/day: 0.00    Types: Cigarettes    Quit date: 05/12/2010    Years since quitting: 13.5   Smokeless tobacco: Never  Vaping Use   Vaping status: Never Used  Substance and Sexual Activity   Alcohol use: No   Drug use: No   Sexual activity: Yes    Birth control/protection: Surgical    Comment: TVH  Other Topics Concern   Not on file  Social History Narrative   Not on file   Social Drivers of Health   Financial Resource Strain: Not on file  Food Insecurity: Not on file  Transportation Needs: Not on file  Physical Activity: Not on file  Stress: Not on file  Social Connections: Not on file  Intimate Partner Violence: Not on file    Family History  Problem Relation Age of Onset   Diabetes Sister    Healthy Mother    Healthy Father     The following portions of the patient's history were reviewed and updated as appropriate: allergies, current medications, past family history, past medical history, past social history, past surgical history and problem list.  Review of Systems A comprehensive review of systems was negative.   Objective:  BP 129/84   Pulse 74   Resp 16   Wt 158 lb 3.2 oz (71.8 kg)   SpO2 100%   BMI 24.78 kg/m  CONSTITUTIONAL: Well-developed, well-nourished female in no acute distress.  HENT:  Normocephalic, atraumatic, External right and left ear normal. Oropharynx is clear and moist EYES: Conjunctivae and EOM are normal. Pupils are equal, round, and reactive to light. No scleral icterus.  NECK: Normal range of motion, supple, no masses.  Normal thyroid.  SKIN: Skin is warm and dry. No rash noted. Not  diaphoretic. No erythema. No pallor. NEUROLGIC: Alert and oriented to person, place, and time. Normal reflexes, muscle tone coordination. No cranial nerve deficit noted. PSYCHIATRIC: Normal mood and affect. Normal behavior. Normal judgment and thought content. CARDIOVASCULAR: Normal heart rate noted, regular rhythm RESPIRATORY: Clear to auscultation bilaterally. Effort and breath sounds normal, no problems with respiration noted. BREASTS: Symmetric in size. No masses, skin changes, nipple drainage, or lymphadenopathy. Taught self breast exam and had patient to demonstrate SBE. ABDOMEN: Soft, normal bowel sounds, no distention noted.  No tenderness, rebound or guarding.  PELVIC: Normal appearing external genitalia; normal appearing vaginal mucosa and cervix.  No abnormal discharge noted.  Pap smear obtained.  Normal uterine size, no other palpable masses, no uterine or adnexal tenderness. MUSCULOSKELETAL: Normal range of motion. No tenderness.  No cyanosis, clubbing, or edema.  2+ distal pulses.   Assessment:  Annual gynecologic examination with pap smear   Plan:   Will follow up results of pap smear and manage accordingly. Mammogram scheduled Routine preventative health maintenance measures emphasized. Please refer to After Visit Summary for other counseling recommendations.   This note has been created with Education officer, environmental. Any transcriptional errors are unintentional.   Rachel SHAUNNA Bohr, NP 11/27/2023, 11:41 AM

## 2023-12-01 LAB — CERVICOVAGINAL ANCILLARY ONLY
Bacterial Vaginitis (gardnerella): NEGATIVE
Candida Glabrata: NEGATIVE
Candida Vaginitis: NEGATIVE
Chlamydia: NEGATIVE
Comment: NEGATIVE
Comment: NEGATIVE
Comment: NEGATIVE
Comment: NEGATIVE
Comment: NEGATIVE
Comment: NORMAL
Neisseria Gonorrhea: NEGATIVE
Trichomonas: NEGATIVE

## 2023-12-04 LAB — CYTOLOGY - PAP
Comment: NEGATIVE
Diagnosis: NEGATIVE
High risk HPV: NEGATIVE

## 2023-12-05 ENCOUNTER — Ambulatory Visit: Payer: Self-pay | Admitting: Primary Care

## 2023-12-11 ENCOUNTER — Ambulatory Visit: Payer: Self-pay

## 2023-12-11 NOTE — Telephone Encounter (Signed)
 FYI Only or Action Required?: FYI only for provider.  Patient was last seen in primary care on 11/27/2023 by Celestia Rosaline SQUIBB, NP.  Called Nurse Triage reporting Results.   Triage Disposition: Information or Advice Only Call  Patient/caregiver understands and will follow disposition?: Yes     Copied from CRM [id number]. Topic: Clinical - Lab/Test Results >> Dec 11, 2023  4:45 PM Donee H wrote: Reason for CRM: Patient's calling regarding lab results Reason for Disposition  Health information question, no triage required and triager able to answer question  Answer Assessment - Initial Assessment Questions 1. REASON FOR CALL: What is the main reason for your call? or How can I best help you?     Daughter calling Rachel Fletcher) reports missed call from clinic to review lab results. Pt declined initially, but triager noted a lot of confusion so then triager called Education officer, community. Interpreter [phone] used to review below:  Celestia Rosaline SQUIBB, NP to Rachel Fletcher, [place]   12/05/23 11:16 PM  Your pap was NEGATIVE for cancerous cells. This means that you will not cervical cell sampling (pap smear) until 2028 (in 3 years). However, we recommend you return to our office every year for an annual well-woman exam; including a clinical breast exam. Therefore, we will see you in 1 year.  Please call our office for any urgent problems or concerns before your annual exam is due. STDs were also negative   2. SYMPTOMS : Do you have any symptoms?      N/a 3. OTHER QUESTIONS: Do you have any other questions?     Triager reinforced providers recommendation of repeat pap smear. Pt reports that she had a very unpleasant experience and would like to TOC. Triager instructed pt to call back when she finds a clinic/provider to TOC to. Patient verbalized understanding .  Protocols used: Information Only Call - No Triage-A-AH

## 2023-12-14 NOTE — Telephone Encounter (Signed)
 Noted

## 2024-04-18 ENCOUNTER — Ambulatory Visit (INDEPENDENT_AMBULATORY_CARE_PROVIDER_SITE_OTHER): Admitting: Primary Care

## 2024-06-09 ENCOUNTER — Ambulatory Visit (HOSPITAL_COMMUNITY)

## 2024-06-09 ENCOUNTER — Ambulatory Visit (HOSPITAL_COMMUNITY)
Admission: EM | Admit: 2024-06-09 | Discharge: 2024-06-09 | Disposition: A | Discharge status: Home / Self Care | Attending: Family Medicine | Admitting: Family Medicine

## 2024-06-09 ENCOUNTER — Other Ambulatory Visit: Payer: Self-pay

## 2024-06-09 ENCOUNTER — Encounter (HOSPITAL_COMMUNITY): Payer: Self-pay | Admitting: *Deleted

## 2024-06-09 DIAGNOSIS — R42 Dizziness and giddiness: Secondary | ICD-10-CM | POA: Diagnosis present

## 2024-06-09 DIAGNOSIS — R002 Palpitations: Secondary | ICD-10-CM | POA: Insufficient documentation

## 2024-06-09 DIAGNOSIS — R079 Chest pain, unspecified: Secondary | ICD-10-CM | POA: Diagnosis present

## 2024-06-09 LAB — COMPREHENSIVE METABOLIC PANEL WITH GFR
ALT: 31 U/L (ref 0–44)
AST: 27 U/L (ref 15–41)
Albumin: 4.7 g/dL (ref 3.5–5.0)
Alkaline Phosphatase: 81 U/L (ref 38–126)
Anion gap: 12 (ref 5–15)
BUN: 16 mg/dL (ref 6–20)
CO2: 26 mmol/L (ref 22–32)
Calcium: 9.8 mg/dL (ref 8.9–10.3)
Chloride: 101 mmol/L (ref 98–111)
Creatinine, Ser: 0.68 mg/dL (ref 0.44–1.00)
GFR, Estimated: >60 mL/min
Glucose, Bld: 120 mg/dL — ABNORMAL HIGH (ref 70–99)
Potassium: 4.7 mmol/L (ref 3.5–5.1)
Sodium: 139 mmol/L (ref 135–145)
Total Bilirubin: <0.2 mg/dL (ref 0.0–1.2)
Total Protein: 8 g/dL (ref 6.5–8.1)

## 2024-06-09 LAB — TSH: TSH: 2.9 u[IU]/mL (ref 0.350–4.500)

## 2024-06-09 LAB — CBC
HCT: 41.2 % (ref 36.0–46.0)
Hemoglobin: 14.2 g/dL (ref 12.0–15.0)
MCH: 30.5 pg (ref 26.0–34.0)
MCHC: 34.5 g/dL (ref 30.0–36.0)
MCV: 88.4 fL (ref 80.0–100.0)
Platelets: 274 10*3/uL (ref 150–400)
RBC: 4.66 MIL/uL (ref 3.87–5.11)
RDW: 12.6 % (ref 11.5–15.5)
WBC: 4 10*3/uL (ref 4.0–10.5)
nRBC: 0 % (ref 0.0–0.2)

## 2024-06-09 LAB — GLUCOSE, POCT (MANUAL RESULT ENTRY): POC Glucose: 138 mg/dL — AB (ref 70–99)

## 2024-06-09 NOTE — ED Provider Notes (Signed)
 " [facility]   [id number] 06/09/24 Arrival Time: 1130  ASSESSMENT & PLAN:  1. Chest pain, unspecified type   2. Dizziness   3. Palpitations    Without current symptoms.  ECG: Performed today and interpreted by me: normal EKG, normal sinus rhythm.  I have personally viewed the imaging studies ordered this visit. CXR: no acute changes; no pneumothorax.  Results for orders placed or performed during the hospital encounter of 06/09/24  POCT glucose (manual entry)   Collection Time: 06/09/24  1:26 PM  Result Value Ref Range   POC Glucose 138 (A) 70 - 99 mg/dl  CBC   Collection Time: 06/09/24  2:02 PM  Result Value Ref Range   WBC 4.0 4.0 - 10.5 K/uL   RBC 4.66 3.87 - 5.11 MIL/uL   Hemoglobin 14.2 12.0 - 15.0 g/dL   HCT 58.7 63.9 - 53.9 %   MCV 88.4 80.0 - 100.0 fL   MCH 30.5 26.0 - 34.0 pg   MCHC 34.5 30.0 - 36.0 g/dL   RDW 87.3 88.4 - 84.4 %   Platelets 274 150 - 400 K/uL   nRBC 0.0 0.0 - 0.2 %  Comprehensive metabolic panel with GFR   Collection Time: 06/09/24  2:02 PM  Result Value Ref Range   Sodium 139 135 - 145 mmol/L   Potassium 4.7 3.5 - 5.1 mmol/L   Chloride 101 98 - 111 mmol/L   CO2 26 22 - 32 mmol/L   Glucose, Bld 120 (H) 70 - 99 mg/dL   BUN 16 6 - 20 mg/dL   Creatinine, Ser 9.31 0.44 - 1.00 mg/dL   Calcium  9.8 8.9 - 10.3 mg/dL   Total Protein 8.0 6.5 - 8.1 g/dL   Albumin 4.7 3.5 - 5.0 g/dL   AST 27 15 - 41 U/L   ALT 31 0 - 44 U/L   Alkaline Phosphatase 81 38 - 126 U/L   Total Bilirubin <0.2 0.0 - 1.2 mg/dL   GFR, Estimated >39 >39 mL/min   Anion gap 12 5 - 15  TSH   Collection Time: 06/09/24  2:30 PM  Result Value Ref Range   TSH 2.900 0.350 - 4.500 uIU/mL   Orders Placed This Encounter  Procedures   Ambulatory referral to Cardiology    Referral Priority:   Routine    Referral Type:   Consultation    Referral Reason:   Specialty Services Required    Number of Visits Requested:   1     Follow-up Information     [facility]  [facility] at [facility].   Specialty: Emergency Medicine Why: If symptoms worsen in any way. Contact information: [address] [place]  [phone]                Chest pain precautions given. Reviewed expectations re: course of current medical issues. Questions answered. Outlined signs and symptoms indicating need for more acute intervention. Patient verbalized understanding. After Visit Summary given.   SUBJECTIVE:  History from: patient. Rachel Fletcher is a 59 y.o. female who presents with complaint of intermittent CP/palpitations. Noted sev days ago. Mild assoc HA that improved after taking Lipitor. Denies current symptoms. Seems to feel mostly at night. Denies GERD symptoms. Pain may stay for 86m to 1h. No specific aggravating or alleviating factors reported. Denies assoc SOB/n/v/diaphoresis. No tx PTA.  Tobacco Use History[1] Social History   Substance and Sexual Activity  Alcohol Use No  Denies recreational drug  use.  OBJECTIVE:  Vitals:   06/09/24 1155  BP: (!) 140/89  Pulse: 75  Resp: 18  Temp: 98 F (36.7 C)  SpO2: 100%    General appearance: alert, oriented, no acute distress Eyes: PERRLA; EOMI; conjunctivae normal HENT: normocephalic; atraumatic Neck: supple with FROM Lungs: without labored respirations; speaks full sentences without difficulty; CTAB Heart: regular rate and rhythm without murmer Chest Wall: without tenderness to palpation Abdomen: soft, non-tender; no guarding or rebound tenderness Extremities: without edema; without calf swelling or tenderness; symmetrical without gross deformities Skin: warm and dry; without rash or lesions Neuro: normal gait Psychological: alert and cooperative; normal mood and affect  Labs: Results for orders placed or performed during the hospital encounter of 06/09/24  POCT glucose (manual entry)   Collection Time: 06/09/24  1:26 PM   Result Value Ref Range   POC Glucose 138 (A) 70 - 99 mg/dl  CBC   Collection Time: 06/09/24  2:02 PM  Result Value Ref Range   WBC 4.0 4.0 - 10.5 K/uL   RBC 4.66 3.87 - 5.11 MIL/uL   Hemoglobin 14.2 12.0 - 15.0 g/dL   HCT 58.7 63.9 - 53.9 %   MCV 88.4 80.0 - 100.0 fL   MCH 30.5 26.0 - 34.0 pg   MCHC 34.5 30.0 - 36.0 g/dL   RDW 87.3 88.4 - 84.4 %   Platelets 274 150 - 400 K/uL   nRBC 0.0 0.0 - 0.2 %  Comprehensive metabolic panel with GFR   Collection Time: 06/09/24  2:02 PM  Result Value Ref Range   Sodium 139 135 - 145 mmol/L   Potassium 4.7 3.5 - 5.1 mmol/L   Chloride 101 98 - 111 mmol/L   CO2 26 22 - 32 mmol/L   Glucose, Bld 120 (H) 70 - 99 mg/dL   BUN 16 6 - 20 mg/dL   Creatinine, Ser 9.31 0.44 - 1.00 mg/dL   Calcium  9.8 8.9 - 10.3 mg/dL   Total Protein 8.0 6.5 - 8.1 g/dL   Albumin 4.7 3.5 - 5.0 g/dL   AST 27 15 - 41 U/L   ALT 31 0 - 44 U/L   Alkaline Phosphatase 81 38 - 126 U/L   Total Bilirubin <0.2 0.0 - 1.2 mg/dL   GFR, Estimated >39 >39 mL/min   Anion gap 12 5 - 15  TSH   Collection Time: 06/09/24  2:30 PM  Result Value Ref Range   TSH 2.900 0.350 - 4.500 uIU/mL   Labs Reviewed  COMPREHENSIVE METABOLIC PANEL WITH GFR - Abnormal; Notable for the following components:      Result Value   Glucose, Bld 120 (*)    All other components within normal limits  GLUCOSE, POCT (MANUAL RESULT ENTRY) - Abnormal; Notable for the following components:   POC Glucose 138 (*)    All other components within normal limits  CBC  TSH    Imaging: DG Chest 2 View Result Date: 06/09/2024 CLINICAL DATA:  Chest pain. EXAM: DG CHEST 2V COMPARISON:  None Available. FINDINGS: The heart size and mediastinal contours are within normal limits. Both lungs are clear. The visualized skeletal structures are unremarkable. IMPRESSION: No active cardiopulmonary disease. Electronically Signed   By: Vanetta Chou M.D.   On: 06/09/2024 15:11     Allergies[2]  History reviewed. No  pertinent past medical history. Social History   Socioeconomic History   Marital status: Married    Spouse name: Not on file   Number of children: Not on  file   Years of education: Not on file   Highest education level: Not on file  Occupational History   Not on file  Tobacco Use   Smoking status: Former    Current packs/day: 0.00    Types: Cigarettes    Quit date: 05/12/2010    Years since quitting: 14.0   Smokeless tobacco: Never  Vaping Use   Vaping status: Never Used  Substance and Sexual Activity   Alcohol use: No   Drug use: No   Sexual activity: Yes    Birth control/protection: Surgical    Comment: TVH  Other Topics Concern   Not on file  Social History Narrative   Not on file   Social Drivers of Health   Tobacco Use: Medium Risk (06/09/2024)   Patient History    Smoking Tobacco Use: Former    Smokeless Tobacco Use: Never    Passive Exposure: Not on [place] Strain: Not on file  Food Insecurity: Not on file  Transportation Needs: Not on file  Physical Activity: Not on file  Stress: Not on file  Social Connections: Not on file  Intimate Partner Violence: Not on file  Depression (PHQ2-9): Medium Risk (11/27/2023)   Depression (PHQ2-9)    PHQ-2 Score: 5  Alcohol Screen: Not on file  Housing: Not on file  Utilities: Not on file  Health Literacy: Not on file   Family History  Problem Relation Age of Onset   Diabetes Sister    Healthy Mother    Healthy Father    Past Surgical History:  Procedure Laterality Date   resection of submucous prolapsed myoma     VAGINAL HYSTERECTOMY     TVH       [1]  Social History Tobacco Use  Smoking Status Former   Current packs/day: 0.00   Types: Cigarettes   Quit date: 05/12/2010   Years since quitting: 14.0  Smokeless Tobacco Never  [2] No Known Allergies    Rolinda Rogue, MD 06/09/24 1654  "

## 2024-06-09 NOTE — Discharge Instructions (Signed)
You have had labs (blood work) sent today. We will call you with any significant abnormalities or if there is need to begin or change treatment or pursue further follow up.  You may also review your test results online through [facility]. If you do not have a MyChart account, instructions to sign up should be on your discharge paperwork.

## 2024-06-09 NOTE — ED Triage Notes (Signed)
 PT reports for a few day she had HA,back pain ,eye lid pain and CP . NO CP today . PT took atorvastatin  for the HA and it went away.. Pt reported she got dizzy and about passed out the patient reports she had a heart attack . PT has never had a Heart attack.

## 2024-06-11 ENCOUNTER — Ambulatory Visit (HOSPITAL_COMMUNITY): Payer: Self-pay
